# Patient Record
Sex: Female | Born: 1965 | Race: White | Hispanic: No | Marital: Married | Smoking: Former smoker
Health system: Southern US, Community
[De-identification: ages and names within clinical notes are randomized; demographics above are authoritative.]

## PROBLEM LIST (undated history)

## (undated) DIAGNOSIS — E079 Disorder of thyroid, unspecified: Secondary | ICD-10-CM

## (undated) DIAGNOSIS — E119 Type 2 diabetes mellitus without complications: Secondary | ICD-10-CM

## (undated) DIAGNOSIS — I10 Essential (primary) hypertension: Secondary | ICD-10-CM

## (undated) DIAGNOSIS — M1990 Unspecified osteoarthritis, unspecified site: Secondary | ICD-10-CM

## (undated) HISTORY — PX: APPENDECTOMY: SHX54

## (undated) HISTORY — PX: KNEE SURGERY: SHX244

---

## 2002-02-26 ENCOUNTER — Encounter: Payer: Self-pay | Admitting: Family Medicine

## 2002-02-26 ENCOUNTER — Ambulatory Visit (HOSPITAL_COMMUNITY): Admission: RE | Admit: 2002-02-26 | Discharge: 2002-02-26 | Payer: Self-pay | Admitting: Family Medicine

## 2005-07-28 ENCOUNTER — Ambulatory Visit (HOSPITAL_COMMUNITY): Admission: RE | Admit: 2005-07-28 | Discharge: 2005-07-28 | Payer: Self-pay | Admitting: Family Medicine

## 2006-05-24 ENCOUNTER — Ambulatory Visit (HOSPITAL_COMMUNITY): Admission: RE | Admit: 2006-05-24 | Discharge: 2006-05-24 | Payer: Self-pay | Admitting: Family Medicine

## 2007-04-15 ENCOUNTER — Ambulatory Visit (HOSPITAL_COMMUNITY): Admission: RE | Admit: 2007-04-15 | Discharge: 2007-04-15 | Payer: Self-pay | Admitting: Family Medicine

## 2007-10-25 ENCOUNTER — Ambulatory Visit (HOSPITAL_COMMUNITY): Admission: RE | Admit: 2007-10-25 | Discharge: 2007-10-25 | Payer: Self-pay | Admitting: General Surgery

## 2007-10-25 ENCOUNTER — Encounter (INDEPENDENT_AMBULATORY_CARE_PROVIDER_SITE_OTHER): Payer: Self-pay | Admitting: General Surgery

## 2010-11-06 ENCOUNTER — Encounter: Payer: Self-pay | Admitting: Family Medicine

## 2010-12-27 ENCOUNTER — Emergency Department (HOSPITAL_COMMUNITY)
Admission: EM | Admit: 2010-12-27 | Discharge: 2010-12-27 | Disposition: A | Discharge status: Home / Self Care | Payer: 59 | Attending: Emergency Medicine | Admitting: Emergency Medicine

## 2010-12-27 DIAGNOSIS — I10 Essential (primary) hypertension: Secondary | ICD-10-CM | POA: Insufficient documentation

## 2010-12-27 DIAGNOSIS — R42 Dizziness and giddiness: Secondary | ICD-10-CM | POA: Insufficient documentation

## 2010-12-27 DIAGNOSIS — E039 Hypothyroidism, unspecified: Secondary | ICD-10-CM | POA: Insufficient documentation

## 2010-12-27 DIAGNOSIS — Z79899 Other long term (current) drug therapy: Secondary | ICD-10-CM | POA: Insufficient documentation

## 2011-02-28 NOTE — Op Note (Signed)
Amber Acevedo, Amber Acevedo                ACCOUNT NO.:  [id number]   MEDICAL RECORD NO.:  [id number]          PATIENT TYPE:  AMB   LOCATION:  DAY                           FACILITY:  [facility]   PHYSICIAN:  Tilford Pillar, MD      DATE OF BIRTH:  24-Mar-1966   DATE OF PROCEDURE:  10/25/2007  DATE OF DISCHARGE:                               OPERATIVE REPORT   PREOPERATIVE DIAGNOSES:  Right medial breast cyst (sebaceous cyst).   POSTPROCEDURE DIAGNOSIS:  Right medial breast cyst (sebaceous cyst).   PROCEDURE:  Excision of right-breast cyst.   SURGEON:  Tilford Pillar, M.D.   ANESTHESIA:  Laryngeal mask airway, sedation and local anesthetic.   ESTIMATED BLOOD LOSS:  Minimal.   SPECIMENS:  Breast cysts times two in one specimen.   INDICATIONS:  Patient is a 45 year old female, who presented to my  office approximately a month ago with a noted erythematous and tender  nodule on her right breast on the medial aspect.  This had spontaneously  drained.  At that time it was suspected this was likely just a sebaceous  cyst, or a superficial cyst.  It was treated with antibiotics and warm  compresses with resolution.  At the followup evaluation in my office, a  palpable nodule was still appreciated in the area and it was recommended  that this be excised with discussion of excision of a cyst with the  patient.  It was also discussed with the patient, although unlikely,  that this could potentially be an early breast cancer.  However, again,  it was discussed with her at length that this was likely a benign  etiology.  At this point, the risks, benefits, and alternatives,  including the risk of infection and bleeding, were discussed at length  with the patient.  The patient's questions and concerns were addressed  and patient was consented for the planned procedure.   OPERATION:  Patient was taken to the operating room and was placed in  supine position, at which time sedation was administered by  anesthesia.  At this time, laryngeal mask airway was placed.  Her right breast was  then prepped and draped in the usual fashion.  A marking pen was  utilized to trace the planned elliptical incision.  During palpation of  the breast, there were two small, discrete nodules within the same area.  The elliptical incision was created to incorporate both of these areas.  A scalpel was then utilized to create the initial incision and  electrocautery was utilize to dissect down through the subcuticular  tissue, being careful to excise the suspected cyst in its entirety on  both sides.  Healthy tissue was identified underneath the cyst.  The  tissue was removed en bloc.  Palpation demonstrated two firm, rubbery  nodules, which were very superficial, within the specimen.  These were  placed on the back table and were sent as permanent specimen to  pathology.  At this point, hemostasis was good, being obtained with  electrocautery.  Palpation of the wound did not elicit any additional  suspected nodules or firmness  and, at this point, the wound was  irrigated with sterile saline.   The wound was dried and then 3-0 Vicryl sutures were utilized to  reapproximate the deep subcuticular tissue in simple interrupted  fashion.  At this point, 4-0 Monocryl was utilized to reapproximate the  skin edges in a running subcuticular sutures.  The skin was washed and  dried with moist and dry towels.  Marcaine 0.5% was injected and then  benzoin was applied around the incision.  Half-inch Steri-Strips were  placed.  The drapes were removed.  The patient was allowed to come out  of sedation and was transferred back to a regular hospital bed.  She was  transferred to the postanesthesia care unit in stable condition.   At the conclusion of the procedure, all instrument, sponge and needle  counts were correct.  The patient tolerated the procedure well.      Tilford Pillar, MD  Electronically Signed      BZ/MEDQ  D:  10/25/2007  T:  10/25/2007  Job:  [id number]   cc:   Patrica Duel, M.D.  Fax: [phone]

## 2011-02-28 NOTE — H&P (Signed)
Amber Acevedo, Amber Acevedo                ACCOUNT NO.:  [id number]   MEDICAL RECORD NO.:  [id number]          PATIENT TYPE:  AMB   LOCATION:  DAY                           FACILITY:  [facility]   PHYSICIAN:  Tilford Pillar, MD      DATE OF BIRTH:  10/25/1965   DATE OF ADMISSION:  DATE OF DISCHARGE:  LH                              HISTORY & PHYSICAL   CHIEF COMPLAINT:  Cyst on right breast.   HISTORY OF PRESENT ILLNESS:  The patient is a 45 year old female, who I  have seen previously in the office for a painful swelling nodule of her  right medial breast and chest wall.  She initially noted this at the  beginning of December and it increased in discomfort.  She noted a rash  at the same time of initial onset.  She had noticed some scan yellow-  green discharge associated with it.  She had had no fever or chills and  no similar nodules or masses in the past.  She says she has had prior  mammograms, but states she has not had one for approximately five years.  After her initial evaluation, she was given antibiotic treatment and was  expressed to continue warm compresses to the area.  In follow-up  evaluation in the office, this area has decreased in size and  discomfort.  The erythema has resolved.  No drainage has been noted.   PAST MEDICAL HISTORY:  Hypothyroidism.   PAST SURGICAL HISTORY:  1. Appendectomy.  2. Cesarean sections x2.   MEDICATIONS:  1. Benicar.  2. Levothyroxine.  3. Zoloft.  4. Concerta.  5. Augmentin (she currently has completed the course of Augmentin).   ALLERGIES:  NO KNOWN DRUG ALLERGIES.   SOCIAL HISTORY:  She is a one-pack-per-day tobacco user.  No alcohol  use.  No recreational drug use.  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  She has had two pregnancies.   FAMILY HISTORY:  Pertinent family history includes coronary artery  disease, diabetes mellitus, and hypertension.  She has no family history  of any breast, ovarian, or cervical  cancers.   REVIEW OF SYSTEMS:  CONSTITUTIONAL:  Occasional has.  EYES:  Unremarkable.  EARS, NOSE, AND THROAT:  Occasional rhinorrhea.  RESPIRATORY:  Occasional cough.  Occasional wheezing.  CARDIOVASCULAR:  Unremarkable.  GASTROINTESTINAL:  Unremarkable.  GENITOURINARY:  Unremarkable.  MUSCULOSKELETAL:  Arthralgias, with neck and joint pain.  SKIN:  Occasional rash and rash as per history of present illness.  She  has had a history of boils in the past.  ENDOCRINOLOGIC:  Occasionally  she has the sensation of no energy.  NEUROLOGIC:  Occasional dizzy  spells; however, these are very infrequent.   PHYSICAL EXAMINATION:  GENERAL:  The patient is a comfortable-appearing  morbidly obese female, in no acute distress.  HEENT:  Scalp:  No deformities.  No masses.  Eyes:  Pupils equal, round,  and reactive to light.  Extraocular movements intact.  No conjunctival  pallor is noted.  Oral mucosa is pink.  Normal occlusion.  NECK:  The trachea is midline.  No cervical lymphadenopathy is apparent.  PULMONARY:  She has unlabored respirations.  No wheezes are appreciated.  Chest is clear to auscultation in the bilateral lung fields.  CARDIOVASCULAR:  Regular rate and rhythm.  No murmurs or gallops, and  her pulses are 2+ radial and dorsalis pedis.  CHEST WALL/BREASTS:  The patient does have an approximate area of a 2-cm  nodule on her medial aspect of her right breast along the maxillary  fold.  This is nontender to palpation at this time.  No discharge or  drainage is noted.  No nipple discharge or drainage is apparent.  No  additional masses are appreciated.  EXTREMITIES:  The extremities are warm and dry.   ASSESSMENT AND PLAN:  Right breast cyst.  Given the recent course and  description of initial presentation, it is suspected that this is a  benign sebaceous cyst of the breast.  However, due to its location, the  possibility of early cancer cannot be excluded, and this was discussed  with  the patient.  At this point, it is recommended that this be excised  as a planned excision of a sebaceous cyst.  However, should this turn  out to be a cancer, this would be considered an excisional biopsy.  This  was discussed with the patient and, in the unlikely event that this  pathologic evaluation determines this to be a cancer, additional  intervention will be required, and this was discussed with the patient.  Again, it was discussed with the patient that this is very unlikely and  that, based on all suspicion at this time, this is a benign sebaceous  cyst.  The patient is also scheduled for a screening mammogram.  It was  advised that as this has been put off by the patient that this be  scheduled following the excision to allow adequate time for  postoperative changes to resolve should this be a benign cyst, and  otherwise immediate mammographic findings would likely be  noncontributory to the current plants.  This was understood by the  patient and will plan to schedule the patient at the earliest  convenience.      Tilford Pillar, MD  Electronically Signed     BZ/MEDQ  D:  10/22/2007  T:  10/22/2007  Job:  [id number]   cc:   Tilford Pillar, MD  Fax: [phone]

## 2011-03-17 ENCOUNTER — Ambulatory Visit (HOSPITAL_BASED_OUTPATIENT_CLINIC_OR_DEPARTMENT_OTHER)
Admission: RE | Admit: 2011-03-17 | Discharge: 2011-03-17 | Disposition: A | Discharge status: Home / Self Care | Payer: 59 | Source: Ambulatory Visit | Attending: Orthopedic Surgery | Admitting: Orthopedic Surgery

## 2011-03-17 DIAGNOSIS — M2240 Chondromalacia patellae, unspecified knee: Secondary | ICD-10-CM | POA: Insufficient documentation

## 2011-03-17 DIAGNOSIS — I10 Essential (primary) hypertension: Secondary | ICD-10-CM | POA: Insufficient documentation

## 2011-03-17 DIAGNOSIS — G4733 Obstructive sleep apnea (adult) (pediatric): Secondary | ICD-10-CM | POA: Insufficient documentation

## 2011-03-17 DIAGNOSIS — M23329 Other meniscus derangements, posterior horn of medial meniscus, unspecified knee: Secondary | ICD-10-CM | POA: Insufficient documentation

## 2011-03-17 DIAGNOSIS — F17200 Nicotine dependence, unspecified, uncomplicated: Secondary | ICD-10-CM | POA: Insufficient documentation

## 2011-03-17 DIAGNOSIS — K219 Gastro-esophageal reflux disease without esophagitis: Secondary | ICD-10-CM | POA: Insufficient documentation

## 2011-03-17 LAB — POCT I-STAT, CHEM 8
BUN: 21 mg/dL (ref 6–23)
Calcium, Ion: 1.14 mmol/L (ref 1.12–1.32)
Chloride: 102 mEq/L (ref 96–112)
Creatinine, Ser: 0.8 mg/dL (ref 0.4–1.2)
Glucose, Bld: 100 mg/dL — ABNORMAL HIGH (ref 70–99)
HCT: 44 % (ref 36.0–46.0)
Hemoglobin: 15 g/dL (ref 12.0–15.0)
Potassium: 3.8 mEq/L (ref 3.5–5.1)
Sodium: 138 mEq/L (ref 135–145)
TCO2: 29 mmol/L (ref 0–100)

## 2011-03-21 NOTE — Op Note (Signed)
  NAMELOUNETTE, SLOAN NO.:  [id number]  MEDICAL RECORD NO.:  [id number]  LOCATION:                                 FACILITY:  PHYSICIAN:  Eulas Post, MD    DATE OF BIRTH:  Apr 09, 1966  DATE OF PROCEDURE:  03/17/2011 DATE OF DISCHARGE:                              OPERATIVE REPORT   ATTENDING SURGEON:  Eulas Post, MD  ASSISTANT:  Janace Litten, OPA  PREOPERATIVE DIAGNOSIS:  Right knee medial meniscus tear.  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  OPERATIVE PROCEDURE:  Right knee arthroscopy with partial medial meniscectomy and chondroplasty of the patellofemoral joint on both the patella and the femoral trochlea.  PREOPERATIVE INDICATIONS:  Amber Acevedo is a 45 year old woman who complained of right knee pain, particularly medially, and elected to undergo right knee arthroscopy with partial medial meniscectomy.  The risks, benefits, and alternatives were discussed her at length including not limited to the risks of infection, bleeding, nerve injury, progression of arthritis, incomplete relief of pain, recurrent meniscal tear, the need for future arthroplasty, cardiopulmonary complications, among others, and she is willing to proceed.  OPERATIVE FINDINGS:  The patellofemoral joint had extensive grade 3 and some grade 4 changes on the femoral trochlea, with grade 3 changes on the patella.  The lateral compartment was essentially normal.  The ACL and PCL were intact.  The medial compartment had grade 2 changes on the femur and grade 2 changes on the tibia with a radial horn medial meniscus tear that appeared to have attempted to heal, but was somewhat extruded, and unstable.  OPERATIVE PROCEDURE:  The  patient was brought to the operating room, placed in supine position.  IV antibiotics were given.  General anesthesia administered.  The right lower extremity was prepped and draped in the usual sterile fashion.  Diagnostic arthroscopy was carried out with the above-named findings.  I used the arthroscopic shaver to debride the undersurface of the patella as well as the femoral trochlea. I also used a shaver and the arthroscopic biter to debride the posterior horn of the medial meniscus back to a stable rim.  She had reasonably substantial degenerative changes that may limit her long-term recovery. After the meniscus was debrided back to a stable rim, I completed the diagnostic arthroscopy, and all the arthroscopic instruments were removed after copious debridement and the portals closed with Monocryl followed by Steri-Strips and sterile gauze.  She was awakened and returned to the PACU in stable and satisfactory condition.  There were no complications, and she tolerated the procedure well.     Eulas Post, MD     JPL/MEDQ  D:  03/17/2011  T:  03/18/2011  Job:  [id number]  Electronically Signed by Teryl Lucy MD on 03/21/2011 07:29:07 AM

## 2011-07-06 LAB — BASIC METABOLIC PANEL
Calcium: 9
GFR calc Af Amer: >60
GFR calc non Af Amer: >60
Sodium: 136

## 2011-07-06 LAB — CBC
Hemoglobin: 13.9
RBC: 4.56
RDW: 13.4
WBC: 9.8

## 2014-03-22 ENCOUNTER — Emergency Department (HOSPITAL_COMMUNITY): Payer: 59

## 2014-03-22 ENCOUNTER — Encounter (HOSPITAL_COMMUNITY): Payer: Self-pay | Admitting: Emergency Medicine

## 2014-03-22 ENCOUNTER — Emergency Department (HOSPITAL_COMMUNITY)
Admission: EM | Admit: 2014-03-22 | Discharge: 2014-03-22 | Disposition: A | Discharge status: Home / Self Care | Payer: 59 | Attending: Emergency Medicine | Admitting: Emergency Medicine

## 2014-03-22 DIAGNOSIS — X500XXA Overexertion from strenuous movement or load, initial encounter: Secondary | ICD-10-CM | POA: Insufficient documentation

## 2014-03-22 DIAGNOSIS — E119 Type 2 diabetes mellitus without complications: Secondary | ICD-10-CM | POA: Insufficient documentation

## 2014-03-22 DIAGNOSIS — Z862 Personal history of diseases of the blood and blood-forming organs and certain disorders involving the immune mechanism: Secondary | ICD-10-CM | POA: Insufficient documentation

## 2014-03-22 DIAGNOSIS — I10 Essential (primary) hypertension: Secondary | ICD-10-CM | POA: Insufficient documentation

## 2014-03-22 DIAGNOSIS — M25561 Pain in right knee: Secondary | ICD-10-CM

## 2014-03-22 DIAGNOSIS — Z8639 Personal history of other endocrine, nutritional and metabolic disease: Secondary | ICD-10-CM | POA: Insufficient documentation

## 2014-03-22 DIAGNOSIS — Z8739 Personal history of other diseases of the musculoskeletal system and connective tissue: Secondary | ICD-10-CM | POA: Insufficient documentation

## 2014-03-22 DIAGNOSIS — Y929 Unspecified place or not applicable: Secondary | ICD-10-CM | POA: Insufficient documentation

## 2014-03-22 DIAGNOSIS — S99929A Unspecified injury of unspecified foot, initial encounter: Principal | ICD-10-CM

## 2014-03-22 DIAGNOSIS — Y9389 Activity, other specified: Secondary | ICD-10-CM | POA: Insufficient documentation

## 2014-03-22 DIAGNOSIS — Z880 Allergy status to penicillin: Secondary | ICD-10-CM | POA: Insufficient documentation

## 2014-03-22 DIAGNOSIS — S99919A Unspecified injury of unspecified ankle, initial encounter: Principal | ICD-10-CM

## 2014-03-22 DIAGNOSIS — S8990XA Unspecified injury of unspecified lower leg, initial encounter: Secondary | ICD-10-CM | POA: Insufficient documentation

## 2014-03-22 DIAGNOSIS — F17200 Nicotine dependence, unspecified, uncomplicated: Secondary | ICD-10-CM | POA: Insufficient documentation

## 2014-03-22 HISTORY — DX: Unspecified osteoarthritis, unspecified site: M19.90

## 2014-03-22 HISTORY — DX: Type 2 diabetes mellitus without complications: E11.9

## 2014-03-22 HISTORY — DX: Essential (primary) hypertension: I10

## 2014-03-22 HISTORY — DX: Disorder of thyroid, unspecified: E07.9

## 2014-03-22 MED ORDER — OXYCODONE-ACETAMINOPHEN 5-325 MG PO TABS: 1.0000 | ORAL_TABLET | ORAL | Status: DC | PRN

## 2014-03-22 NOTE — Discharge Instructions (Signed)
Wear the immobilizer, use crutches as needed.  Crutch Use Crutches are used to take weight off one of your legs or feet when you stand or walk. It is important to use crutches that fit properly. When fitted properly:  Each crutch should be 2 3 finger widths below the armpit.  Your weight should be supported by your hand, and not by resting the armpit on the crutch.  RISKS AND COMPLICATIONS Damage to the nerves that extend from your armpit to your hand and arm. To prevent this from happening, make sure your crutches fit properly and do not put pressure on your armpit when using them. HOW TO USE YOUR CRUTCHES If you have been instructed to use partial weight bearing, apply (bear) the amount of weight as your health care provider suggests. Do not bear weight in an amount that causes pain to the area of injury. Walking 1. Step with the crutches. 2. Swing the healthy leg slightly ahead of the crutches. Going Up Steps If there is no handrail: 1. Step up with the healthy leg. 2. Step up with the crutches and injured leg. 3. Continue in this way. If there is a handrail: 1. Hold both crutches in one hand. 2. Place your free hand on the handrail. 3. While putting your weight on your arms, lift your healthy leg to the step. 4. Bring the crutches and the injured leg up to that step. 5. Continue in this way. Going Down Steps Be very careful, as going down stairs with crutches is very challenging. If there is no handrail: 1. Step down with the injured leg and crutches. 2. Step down with the healthy leg. If there is a handrail: 1. Place your hand on the handrail. 2. Hold both crutches with your free hand. 3. Lower your injured leg and crutch to the step below you. Make sure to keep the crutch tips in the center of the step, never on the edge. 4. Lower your healthy leg to that step. 5. Continue in this way. Standing Up 1. Hold the injured leg forward. 2. Grab the armrest with one hand and the  top of the crutches with the other hand. 3. Using these supports, pull yourself up to a standing position. Sitting Down 1. Hold the injured leg forward. 2. Grab the armrest with one hand and the top of the crutches with the other hand. 3. Lower yourself to a sitting position. SEEK MEDICAL CARE IF:  You still feel unsteady on your feet.  You develop new pain, for example in your armpits, back, shoulder, wrist, or hip.  You develop any numbness or tingling. SEEK IMMEDIATE MEDICAL CARE: You fall. Document Released: 09/29/2000 Document Revised: 07/23/2013 Document Reviewed: 06/09/2013 [facility] Patient Information 2014 [place], [place].  Acetaminophen; Oxycodone tablets What is this medicine? ACETAMINOPHEN; OXYCODONE (a set a MEE noe fen; ox i KOE done) is a pain reliever. It is used to treat mild to moderate pain. This medicine may be used for other purposes; ask your health care provider or pharmacist if you have questions. COMMON BRAND NAME(S): Endocet, Magnacet, Narvox, Percocet, Perloxx, Primalev, Primlev, Roxicet, Xolox What should I tell my health care provider before I take this medicine? They need to know if you have any of these conditions: -brain tumor -Crohn's disease, inflammatory bowel disease, or ulcerative colitis -drug abuse or addiction -head injury -heart or circulation problems -if you often drink alcohol -kidney disease or problems going to the bathroom -liver disease -lung disease, asthma, or breathing problems -  an unusual or allergic reaction to acetaminophen, oxycodone, other opioid analgesics, other medicines, foods, dyes, or preservatives -pregnant or trying to get pregnant -breast-feeding How should I use this medicine? Take this medicine by mouth with a full glass of water. Follow the directions on the prescription label. Take your medicine at regular intervals. Do not take your medicine more often than directed. Talk to your pediatrician regarding the use  of this medicine in children. Special care may be needed. Patients over 20 years old may have a stronger reaction and need a smaller dose. Overdosage: If you think you have taken too much of this medicine contact a poison control center or emergency room at once. NOTE: This medicine is only for you. Do not share this medicine with others. What if I miss a dose? If you miss a dose, take it as soon as you can. If it is almost time for your next dose, take only that dose. Do not take double or extra doses. What may interact with this medicine? -alcohol -antihistamines -barbiturates like amobarbital, butalbital, butabarbital, methohexital, pentobarbital, phenobarbital, thiopental, and secobarbital -benztropine -drugs for bladder problems like solifenacin, trospium, oxybutynin, tolterodine, hyoscyamine, and methscopolamine -drugs for breathing problems like ipratropium and tiotropium -drugs for certain stomach or intestine problems like propantheline, homatropine methylbromide, glycopyrrolate, atropine, belladonna, and dicyclomine -general anesthetics like etomidate, ketamine, nitrous oxide, propofol, desflurane, enflurane, halothane, isoflurane, and sevoflurane -medicines for depression, anxiety, or psychotic disturbances -medicines for sleep -muscle relaxants -naltrexone -narcotic medicines (opiates) for pain -phenothiazines like perphenazine, thioridazine, chlorpromazine, mesoridazine, fluphenazine, prochlorperazine, promazine, and trifluoperazine -scopolamine -tramadol -trihexyphenidyl This list may not describe all possible interactions. Give your health care provider a list of all the medicines, herbs, non-prescription drugs, or dietary supplements you use. Also tell them if you smoke, drink alcohol, or use illegal drugs. Some items may interact with your medicine. What should I watch for while using this medicine? Tell your doctor or health care professional if your pain does not go away,  if it gets worse, or if you have new or a different type of pain. You may develop tolerance to the medicine. Tolerance means that you will need a higher dose of the medication for pain relief. Tolerance is normal and is expected if you take this medicine for a long time. Do not suddenly stop taking your medicine because you may develop a severe reaction. Your body becomes used to the medicine. This does NOT mean you are addicted. Addiction is a behavior related to getting and using a drug for a non-medical reason. If you have pain, you have a medical reason to take pain medicine. Your doctor will tell you how much medicine to take. If your doctor wants you to stop the medicine, the dose will be slowly lowered over time to avoid any side effects. You may get drowsy or dizzy. Do not drive, use machinery, or do anything that needs mental alertness until you know how this medicine affects you. Do not stand or sit up quickly, especially if you are an older patient. This reduces the risk of dizzy or fainting spells. Alcohol may interfere with the effect of this medicine. Avoid alcoholic drinks. There are different types of narcotic medicines (opiates) for pain. If you take more than one type at the same time, you may have more side effects. Give your health care provider a list of all medicines you use. Your doctor will tell you how much medicine to take. Do not take more medicine than directed. Call  emergency for help if you have problems breathing. The medicine will cause constipation. Try to have a bowel movement at least every 2 to 3 days. If you do not have a bowel movement for 3 days, call your doctor or health care professional. Do not take Tylenol (acetaminophen) or medicines that have acetaminophen with this medicine. Too much acetaminophen can be very dangerous. Many nonprescription medicines contain acetaminophen. Always read the labels carefully to avoid taking more acetaminophen. What side effects may I  notice from receiving this medicine? Side effects that you should report to your doctor or health care professional as soon as possible: -allergic reactions like skin rash, itching or hives, swelling of the face, lips, or tongue -breathing difficulties, wheezing -confusion -light headedness or fainting spells -severe stomach pain -unusually weak or tired -yellowing of the skin or the whites of the eyes  Side effects that usually do not require medical attention (report to your doctor or health care professional if they continue or are bothersome): -dizziness -drowsiness -nausea -vomiting This list may not describe all possible side effects. Call your doctor for medical advice about side effects. You may report side effects to FDA at 1-800-FDA-1088. Where should I keep my medicine? Keep out of the reach of children. This medicine can be abused. Keep your medicine in a safe place to protect it from theft. Do not share this medicine with anyone. Selling or giving away this medicine is dangerous and against the law. Store at room temperature between 20 and 25 degrees C (68 and 77 degrees F). Keep container tightly closed. Protect from light. This medicine may cause accidental overdose and death if it is taken by other adults, children, or pets. Flush any unused medicine down the toilet to reduce the chance of harm. Do not use the medicine after the expiration date. NOTE: This sheet is a summary. It may not cover all possible information. If you have questions about this medicine, talk to your doctor, pharmacist, or health care provider.  2014, [employer]. (2013-05-26 13:17:35)

## 2014-03-22 NOTE — ED Notes (Addendum)
Pt reporting pain in right knee.  Reports history of arthritis and torn meniscus in knee.  Pt states that she "felt something pop" in knee one day last week, pain increased since that time.

## 2014-03-22 NOTE — ED Provider Notes (Signed)
CSN: [id number]     Arrival date & time 03/22/14  0111 History   First MD Initiated Contact with Patient 03/22/14 0129     Chief Complaint  Patient presents with  . Knee Pain     (Consider location/radiation/quality/duration/timing/severity/associated sxs/prior Treatment) Patient is a 48 y.o. female presenting with knee pain. The history is provided by the patient.  Knee Pain She injured her right knee about one week ago when she rolled over in bed and felt something pop. Since then, she is complaining of pain in the right knee. Pain is severe and she rates it a 9/10. It is worse with any movement. Nothing makes it better. She's taking over-the-counter ibuprofen with no relief. Pain is similar to what she had several years ago when she tore a meniscus in her knee.  Past Medical History  Diagnosis Date  . Diabetes mellitus without complication   . Hypertension   . Thyroid disease   . Arthritis    Past Surgical History  Procedure Laterality Date  . Appendectomy    . Knee surgery    . Cesarean section     History reviewed. No pertinent family history. History  Substance Use Topics  . Smoking status: Current Every Day Smoker -- 1.00 packs/day  . Smokeless tobacco: Not on file  . Alcohol Use: No   OB History   Grav Para Term Preterm Abortions TAB SAB Ect Mult Living                 Review of Systems  All other systems reviewed and are negative.     Allergies  Amoxicillin  Home Medications   Prior to Admission medications   Not on File   BP 139/82  Pulse 80  Temp(Src) 98.7 F (37.1 C) (Oral)  Resp 18  Ht 5' (1.524 m)  Wt 244 lb (110.678 kg)  BMI 47.65 kg/m2  SpO2 100% Physical Exam  Nursing note and vitals reviewed.  48 year old female, resting comfortably and in no acute distress. Vital signs are normal. Oxygen saturation is 100%, which is normal. Head is normocephalic and atraumatic. PERRLA, EOMI. Oropharynx is clear. Neck is nontender and supple without  adenopathy or JVD. Back is nontender and there is no CVA tenderness. Lungs are clear without rales, wheezes, or rhonchi. Chest is nontender. Heart has regular rate and rhythm without murmur. Abdomen is soft, flat, nontender without masses or hepatosplenomegaly and peristalsis is normoactive. Extremities: There is a small effusion present in the right knee. There is no tenderness anteriorly, medially, laterally but there is tenderness in the popliteal fossa and extending into the proximal part of the calf. There is no instability on valgus or varus stress. There is significant pain with any passive movement of the knee. Lachman and McMurray's tests are grossly negative but suboptimal because of degree of pain with passive movement of the knee. Distal neurovascular exam is intact with strong pulses, prompt capillary refill, and normal sensation. Skin is warm and dry without rash. Neurologic: Mental status is normal, cranial nerves are intact, there are no motor or sensory deficits.  ED Course  Procedures (including critical care time)  Imaging Review Dg Knee Complete 4 Views Right  03/22/2014   CLINICAL DATA:  Knee pain.  EXAM: RIGHT KNEE - COMPLETE 4+ VIEW  COMPARISON:  MRI 08/15/2010.  FINDINGS: There are tricompartmental degenerative changes with joint space narrowing and osteophytic spurring. No acute fracture or osteochondral lesion. There is a moderate-sized joint effusion.  IMPRESSION:  Tricompartmental degenerative changes but no acute fracture.  Moderate-sized joint effusion.   Electronically Signed   By: Loralie ChampagneMark  Gallerani M.D.   On: 03/22/2014 02:22    MDM   Final diagnoses:  Pain in right knee    Right knee pain of uncertain cause. Possible Baker's cyst, possible meniscus injury. She'll be sent for plain films of her knee and referred to orthopedics for followup. She will be placed in knee immobilizer, given crutches, and given a prescription for oxycodone-acetaminophen for  pain.    Dione Boozeavid Xachary Hambly, MD 03/22/14 [phone]

## 2014-03-24 MED FILL — Oxycodone w/ Acetaminophen Tab 5-325 MG: ORAL | Qty: 6 | Status: AC

## 2015-03-17 ENCOUNTER — Ambulatory Visit (HOSPITAL_COMMUNITY)
Admission: RE | Admit: 2015-03-17 | Discharge: 2015-03-17 | Disposition: A | Discharge status: Home / Self Care | Payer: Self-pay | Attending: Psychiatry | Admitting: Psychiatry
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
## 2015-06-02 ENCOUNTER — Inpatient Hospital Stay
Admit: 2015-06-02 | Discharge: 2015-06-07 | DRG: 885 | Disposition: A | Discharge status: Home / Self Care | Payer: No Typology Code available for payment source | Source: Intra-hospital | Attending: Psychiatry | Admitting: Psychiatry

## 2015-06-02 DIAGNOSIS — Z880 Allergy status to penicillin: Secondary | ICD-10-CM

## 2015-06-02 DIAGNOSIS — Z9889 Other specified postprocedural states: Secondary | ICD-10-CM | POA: Diagnosis not present

## 2015-06-02 DIAGNOSIS — Z9049 Acquired absence of other specified parts of digestive tract: Secondary | ICD-10-CM | POA: Diagnosis present

## 2015-06-02 DIAGNOSIS — Z818 Family history of other mental and behavioral disorders: Secondary | ICD-10-CM | POA: Diagnosis not present

## 2015-06-02 DIAGNOSIS — F4310 Post-traumatic stress disorder, unspecified: Secondary | ICD-10-CM | POA: Diagnosis present

## 2015-06-02 DIAGNOSIS — G4700 Insomnia, unspecified: Secondary | ICD-10-CM | POA: Diagnosis present

## 2015-06-02 DIAGNOSIS — R21 Rash and other nonspecific skin eruption: Secondary | ICD-10-CM | POA: Diagnosis present

## 2015-06-02 DIAGNOSIS — F333 Major depressive disorder, recurrent, severe with psychotic symptoms: Principal | ICD-10-CM | POA: Diagnosis present

## 2015-06-02 DIAGNOSIS — F42 Obsessive-compulsive disorder: Secondary | ICD-10-CM | POA: Diagnosis present

## 2015-06-02 DIAGNOSIS — M1990 Unspecified osteoarthritis, unspecified site: Secondary | ICD-10-CM | POA: Diagnosis present

## 2015-06-02 DIAGNOSIS — I10 Essential (primary) hypertension: Secondary | ICD-10-CM | POA: Diagnosis present

## 2015-06-02 DIAGNOSIS — Z79899 Other long term (current) drug therapy: Secondary | ICD-10-CM

## 2015-06-02 DIAGNOSIS — F17210 Nicotine dependence, cigarettes, uncomplicated: Secondary | ICD-10-CM | POA: Diagnosis present

## 2015-06-02 DIAGNOSIS — F17200 Nicotine dependence, unspecified, uncomplicated: Secondary | ICD-10-CM

## 2015-06-02 DIAGNOSIS — E119 Type 2 diabetes mellitus without complications: Secondary | ICD-10-CM | POA: Diagnosis present

## 2015-06-02 DIAGNOSIS — Z79891 Long term (current) use of opiate analgesic: Secondary | ICD-10-CM | POA: Diagnosis not present

## 2015-06-02 DIAGNOSIS — E039 Hypothyroidism, unspecified: Secondary | ICD-10-CM | POA: Diagnosis present

## 2015-06-02 DIAGNOSIS — F410 Panic disorder [episodic paroxysmal anxiety] without agoraphobia: Secondary | ICD-10-CM | POA: Diagnosis present

## 2015-06-02 DIAGNOSIS — F332 Major depressive disorder, recurrent severe without psychotic features: Secondary | ICD-10-CM | POA: Diagnosis present

## 2015-06-02 LAB — COMPREHENSIVE METABOLIC PANEL
ALBUMIN: 4.2 g/dL (ref 3.5–5.0)
ALT: 13 U/L — AB (ref 14–54)
AST: 19 U/L (ref 15–41)
Alkaline Phosphatase: 89 U/L (ref 38–126)
Anion gap: 10 (ref 5–15)
BUN: 15 mg/dL (ref 6–20)
CHLORIDE: 99 mmol/L — AB (ref 101–111)
CO2: 25 mmol/L (ref 22–32)
CREATININE: 0.73 mg/dL (ref 0.44–1.00)
Calcium: 8.9 mg/dL (ref 8.9–10.3)
GFR calc Af Amer: >60 mL/min (ref >60)
GLUCOSE: 129 mg/dL — AB (ref 65–99)
POTASSIUM: 3.4 mmol/L — AB (ref 3.5–5.1)
Sodium: 134 mmol/L — ABNORMAL LOW (ref 135–145)
Total Bilirubin: 0.7 mg/dL (ref 0.3–1.2)
Total Protein: 7.5 g/dL (ref 6.5–8.1)

## 2015-06-02 LAB — CBC
HEMATOCRIT: 47.4 % — AB (ref 35.0–47.0)
Hemoglobin: 16 g/dL (ref 12.0–16.0)
MCH: 30.2 pg (ref 26.0–34.0)
MCHC: 33.7 g/dL (ref 32.0–36.0)
MCV: 89.5 fL (ref 80.0–100.0)
PLATELETS: 238 10*3/uL (ref 150–440)
RBC: 5.29 MIL/uL — ABNORMAL HIGH (ref 3.80–5.20)
RDW: 13.3 % (ref 11.5–14.5)
WBC: 11 10*3/uL (ref 3.6–11.0)

## 2015-06-02 LAB — LIPID PANEL
Cholesterol: 176 mg/dL (ref 0–200)
HDL: 63 mg/dL (ref >40)
LDL Cholesterol: 84 mg/dL (ref 0–99)
TRIGLYCERIDES: 144 mg/dL (ref <150)
Total CHOL/HDL Ratio: 2.8 RATIO
VLDL: 29 mg/dL (ref 0–40)

## 2015-06-02 LAB — ETHANOL: Alcohol, Ethyl (B): <5 mg/dL (ref <5)

## 2015-06-02 LAB — TSH: TSH: 0.184 u[IU]/mL — AB (ref 0.350–4.500)

## 2015-06-02 MED ORDER — OLANZAPINE 10 MG IM SOLR
10.0000 mg | Freq: Two times a day (BID) | INTRAMUSCULAR | Status: DC | PRN
  Filled 2015-06-02: qty 10

## 2015-06-02 MED ORDER — MAGNESIUM HYDROXIDE 400 MG/5ML PO SUSP: 30.0000 mL | Freq: Every day | ORAL | Status: DC | PRN

## 2015-06-02 MED ORDER — LEVOTHYROXINE SODIUM 75 MCG PO TABS: 300.0000 ug | ORAL_TABLET | Freq: Every day | ORAL | Status: DC

## 2015-06-02 MED ORDER — IBUPROFEN 600 MG PO TABS: 600.0000 mg | ORAL_TABLET | Freq: Four times a day (QID) | ORAL | Status: DC | PRN

## 2015-06-02 MED ORDER — ALUM & MAG HYDROXIDE-SIMETH 200-200-20 MG/5ML PO SUSP: 30.0000 mL | ORAL | Status: DC | PRN

## 2015-06-02 MED ORDER — LORAZEPAM 2 MG PO TABS
2.0000 mg | ORAL_TABLET | Freq: Three times a day (TID) | ORAL | Status: DC | PRN
Start: 2015-06-02 — End: 2015-06-03
  Administered 2015-06-02: 2 mg via ORAL
  Filled 2015-06-02: qty 1

## 2015-06-02 MED ORDER — LEVOTHYROXINE SODIUM 75 MCG PO TABS
250.0000 ug | ORAL_TABLET | Freq: Every day | ORAL | Status: DC
  Administered 2015-06-03 – 2015-06-07 (×5): 250 ug via ORAL
  Filled 2015-06-02 (×5): qty 2

## 2015-06-02 MED ORDER — OLANZAPINE 10 MG PO TABS: 10.0000 mg | ORAL_TABLET | Freq: Three times a day (TID) | ORAL | Status: DC | PRN

## 2015-06-02 MED ORDER — TRAZODONE HCL 100 MG PO TABS
100.0000 mg | ORAL_TABLET | Freq: Every evening | ORAL | Status: DC | PRN
  Administered 2015-06-03 – 2015-06-06 (×4): 100 mg via ORAL
  Filled 2015-06-02 (×5): qty 1

## 2015-06-02 MED ORDER — NICOTINE 21 MG/24HR TD PT24
21.0000 mg | MEDICATED_PATCH | Freq: Every day | TRANSDERMAL | Status: DC
  Administered 2015-06-03 – 2015-06-04 (×2): 21 mg via TRANSDERMAL
  Filled 2015-06-02 (×3): qty 1

## 2015-06-02 MED ORDER — ACETAMINOPHEN 325 MG PO TABS: 650.0000 mg | ORAL_TABLET | Freq: Four times a day (QID) | ORAL | Status: DC | PRN

## 2015-06-02 NOTE — Tx Team (Signed)
Initial Interdisciplinary Treatment Plan   PATIENTSTRESSORS: Health problems Traumatic event feels she kill people     PATIENT STRENGTHS: Ability for insight Average or above average intelligence Capable of independent living Supportive family/friends   PROBLEM LIST: Problem List/Patient Goals Date to be addressed Date deferred Reason deferred Estimated date of resolution  Altered Thought  06/02/15     Delusional   06/02/15                                                DISCHARGE CRITERIA:  Adequate post-discharge living arrangements Safe-care adequate arrangements made  PRELIMINARY DISCHARGE PLAN: Outpatient therapy Participate in family therapy  PATIENT/FAMIILY INVOLVEMENT: This treatment plan has been presented to and reviewed with the patient, Amber Acevedo, and/or family member, .  The patient and family have been given the opportunity to ask questions and make suggestions.  [name] A Rilee Knoll 06/02/2015, 7:07 PM

## 2015-06-02 NOTE — Plan of Care (Signed)
Problem: Alteration in thought process Goal: LTG-Patient has not harmed self or others in at least 2 days Outcome: Not Progressing delusional at present

## 2015-06-02 NOTE — Progress Notes (Signed)
Admission Note:  D: Pt appeared depressed  With  a flat affect.  Pt  denies SI / AVH at this time. Patient stated she feels she has killed people in her family. Stated also she feels her family brought her here and dumped her   And laugh about ir.  Pt constantly voice of people out to get her , tearful during  interview,  Pt is redirectable and cooperative with assessment.      A: Pt admitted to unit per protocol, skin assessment and search done and no contraband found.  Pt  educated on therapeutic milieu rules. Pt was introduced to milieu by nursing staff.    R: Pt was receptive to education about the milieu .  15 min safety checks started. Clinical research associate offered support

## 2015-06-03 ENCOUNTER — Encounter: Payer: Self-pay | Admitting: Psychiatry

## 2015-06-03 DIAGNOSIS — F333 Major depressive disorder, recurrent, severe with psychotic symptoms: Secondary | ICD-10-CM | POA: Diagnosis present

## 2015-06-03 LAB — URINALYSIS COMPLETE WITH MICROSCOPIC (ARMC ONLY)
Bilirubin Urine: NEGATIVE
Glucose, UA: NEGATIVE mg/dL
Ketones, ur: NEGATIVE mg/dL
Leukocytes, UA: NEGATIVE
Nitrite: NEGATIVE
PH: 7 (ref 5.0–8.0)
PROTEIN: NEGATIVE mg/dL
Specific Gravity, Urine: 1.009 (ref 1.005–1.030)

## 2015-06-03 LAB — PREGNANCY, URINE: Preg Test, Ur: NEGATIVE

## 2015-06-03 LAB — URINE DRUG SCREEN, QUALITATIVE (ARMC ONLY)
Amphetamines, Ur Screen: NOT DETECTED
Barbiturates, Ur Screen: NOT DETECTED
Benzodiazepine, Ur Scrn: NOT DETECTED
CANNABINOID 50 NG, UR ~~LOC~~: NOT DETECTED
COCAINE METABOLITE, UR ~~LOC~~: NOT DETECTED
MDMA (ECSTASY) UR SCREEN: NOT DETECTED
Methadone Scn, Ur: NOT DETECTED
OPIATE, UR SCREEN: NOT DETECTED
PHENCYCLIDINE (PCP) UR S: NOT DETECTED
Tricyclic, Ur Screen: NOT DETECTED

## 2015-06-03 LAB — HEMOGLOBIN A1C: HEMOGLOBIN A1C: 5.5 % (ref 4.0–6.0)

## 2015-06-03 MED ORDER — FLUVOXAMINE MALEATE 50 MG PO TABS
50.0000 mg | ORAL_TABLET | Freq: Every day | ORAL | Status: DC
  Administered 2015-06-03: 50 mg via ORAL
  Filled 2015-06-03: qty 1

## 2015-06-03 MED ORDER — HALOPERIDOL 2 MG PO TABS
2.0000 mg | ORAL_TABLET | Freq: Every day | ORAL | Status: DC
  Administered 2015-06-03 – 2015-06-06 (×4): 2 mg via ORAL
  Filled 2015-06-03 (×4): qty 1

## 2015-06-03 MED ORDER — LORAZEPAM 0.5 MG PO TABS
0.5000 mg | ORAL_TABLET | Freq: Four times a day (QID) | ORAL | Status: DC | PRN
  Administered 2015-06-03: 0.5 mg via ORAL
  Filled 2015-06-03: qty 1

## 2015-06-03 NOTE — Progress Notes (Signed)
D: Patient denies SI/HI/AVH.  Patient affect is flat and her mood is depressed and anxious.  Patient did NOT attend evening group. Patient visible on the milieu. No distress noted. A: Support and encouragement offered. Scheduled medications given to pt. Q 15 min checks continued for patient safety. R: Patient receptive. Patient remains safe on the unit.

## 2015-06-03 NOTE — [facility] Group Notes (Signed)
[facility] Group Notes:  (Nursing/MHT/Case Management/Adjunct)  Date:  06/03/2015  Time:  9:37 AM  Goal  Participation Level:  Goal Setting  Participation Quality:  Appropriate  Affect:  Appropriate  Cognitive:  Appropriate  Insight:  Good  Engagement in Group:  Engaged  Modes of Intervention:  Activity  Summary of Progress/Problems:  Mayra Neer 06/03/2015, 9:37 AM

## 2015-06-03 NOTE — Tx Team (Signed)
Interdisciplinary Treatment Plan Update (Adult)  Date:  06/03/2015 Time Reviewed:  1:34 PM  Progress in Treatment: Attending groups: Yes. Participating in groups:  Yes. Taking medication as prescribed:  Yes. Tolerating medication:  Yes. Family/Significant othe contact made:  No, will contact:  Family Patient understands diagnosis:  Yes. Discussing patient identified problems/goals with staff:  Yes. Medical problems stabilized or resolved:  Yes. Denies suicidal/homicidal ideation: Yes.  Issues/concerns per patient self-inventory:  Yes. Other:  New problem(s) identified: No, Describe:  None  Discharge Plan or Barriers: Pt plans to return home and follow up with outpatient.   Reason for Continuation of Hospitalization: Delusions  Medication stabilization  Comments:Amber Acevedo has a history of depression. She was treated with Zoloft and Concerta with improvement for several years. She stopped taking medication a year and a half ago. She gradually became increasingly depressed and anxious. She felt that she was able to control her symptoms but in the past several months he became psychotic. She is paranoid feeling that people are out to get her. She believes that her family is going to harm her for all the awful things that she has done to them. She believes that she could have killed some of her family members. She believes that everybody was her by her now conspired to put her in the hospital forever. She hears her name called. She sees shadows. She reports extremely poor sleep, decreased appetite with weight loss, poor energy and concentration, social isolation, feeling of guilt and hopelessness worthlessness, anhedonia, crying spells and psychotic symptoms. She reports heightened anxiety with panic attacks, nightmares and flashbacks of PTSD type, and OCD with excessive worries cleaning, organizing, rechecking information. She has been drawing attention to her unusual behavior from family and  from coworkers. She denies symptoms suggestive of bipolar mania. She denies alcohol or illicit substance use. Past psychiatric history. She has never been hospitalized. No history of substance use. No suicide attempts. She was treated in the past with Zoloft for depression and Concerta for ADHD. She believes that the medications were helpful. She stopped taking them one half years ago. She did [facility] a [facility] recently but was only prescribed trazodone for sleep.  Estimated length of stay: Possible d/c on Monday   New goal(s): NA  Review of initial/current patient goals per problem list:   1.  Goal(s): Patient will participate in aftercare plan * Met:  * Target date: at discharge * As evidenced by: Patient will participate within aftercare plan AEB aftercare provider and housing plan at discharge being identified.  2.  Goal (s): Patient will demonstrate decreased symptoms of psychosis. * Met: No  *  Target date: at discharge * As evidenced by: Patient will not endorse signs of psychosis or be deemed stable for discharge by MD.   Attendees: Patient:  Amber Acevedo 8/18/20161:34 PM  Family:   8/18/20161:34 PM  Physician:  Dr. Guadlupe Spanish  8/18/20161:34 PM  Nursing:   Floyde Parkins, RN  8/18/20161:34 PM  Clinical Social Worker:  [place], [place] 8/18/20161:34 PM  Counselor:   8/18/20161:34 PM  Other:  Everitt Amber, LRT  8/18/20161:34 PM  Other:   8/18/20161:34 PM  Other:   8/18/20161:34 PM  Other:  8/18/20161:34 PM  Other:  8/18/20161:34 PM  Other:  8/18/20161:34 PM  Other:  8/18/20161:34 PM  Other:  8/18/20161:34 PM  Other:  8/18/20161:34 PM  Other:   8/18/20161:34 PM   Scribe for Treatment Team:   Eual Lindstrom L Leonia Heatherly,MSW, LCSWA  06/03/2015, 1:34 PM

## 2015-06-03 NOTE — [facility] Group Notes (Signed)
[facility] Group Notes:  (Nursing/MHT/Case Management/Adjunct)  Date:  06/03/2015  Time:  1:51 PM  Type of Therapy:  Psychoeducational Skills  Participation Level:  Minimal  Participation Quality:  Appropriate  Affect:  Flat  Cognitive:  Appropriate  Insight:  Appropriate  Engagement in Group:  Engaged  Modes of Intervention:  Activity and Socialization  Summary of Progress/Problems:  Amber Acevedo 06/03/2015, 1:51 PM

## 2015-06-03 NOTE — Progress Notes (Signed)
Recreation Therapy Notes  Date: 08.18.16 Time: 3:00 pm Location: Craft Room  Group Topic: Leisure Education  Goal Area(s) Addresses:  Patient will identify activities for each letter of the alphabet. Patient will verbalize ability to integrate positive leisure into life post d/c. Patient will verbalize ability to use leisure as a coping mechanism.  Behavioral Response: Attentive, Interactive  Intervention: Leisure Alphabet  Activity: Patients were given a leisure Information systems manager and instructed to list healthy leisure activities for each letter of the alphabet.  Education: LRT educated patient on what is needed to participate in leisure  Education Outcome: Acknowledges education/In group clarification offered   Clinical Observations/Feedback: Patient completed approximately 95% of activity. Patient contributed to group discussion by stating healthy leisure activities.  Jacquelynn Cree, LRT/CTRS 06/03/2015 4:02 PM

## 2015-06-03 NOTE — H&P (Signed)
Psychiatric Admission Assessment Adult  Patient Identification: Amber Acevedo MRN:  [id number] Date of Evaluation:  06/03/2015 Chief Complaint:  SCHIZOPHRENIA Principal Diagnosis: Major depressive disorder, recurrent episode, severe with mood-congruent psychotic features Diagnosis:   Patient Active Problem List   Diagnosis Date Noted  . Major depressive disorder, recurrent episode, severe with mood-congruent psychotic features [F33.3] 06/03/2015  . HTN (hypertension) [I10] 06/02/2015  . Hypothyroidism [E03.9] 06/02/2015  . Tobacco use disorder [Z72.0] 06/02/2015   History of Present Illness::   Identifying data. Ms. Amber Acevedo is an 49 year old female with history of depression and anxiety.  Chief complaint. "I am paranoid."  History of present illness. Ms. Amber Acevedo has a history of depression. She was treated with Zoloft and Concerta with improvement for several years. She stopped taking medication a year and a half ago. She gradually became increasingly depressed and anxious. She felt that she was able to control her symptoms but in the past several months he became psychotic. She is paranoid feeling that people are out to get her. She believes that her family is going to harm her for all the awful things that she has done to them. She believes that she could have killed some of her family members. She believes that everybody was her by her now conspired to put her in the hospital forever. She hears her name called. She sees shadows. She reports extremely poor sleep, decreased appetite with weight loss, poor energy and concentration, social isolation, feeling of guilt and hopelessness worthlessness, anhedonia, crying spells and psychotic symptoms. She reports heightened anxiety with panic attacks, nightmares and flashbacks of PTSD type, and OCD with excessive worries cleaning, organizing, rechecking information. She has been drawing attention to her unusual behavior from family and from coworkers. She  denies symptoms suggestive of bipolar mania. She denies alcohol or illicit substance use.  Past psychiatric history. She has never been hospitalized. No history of substance use. No suicide attempts. She was treated in the past with Zoloft for depression and Concerta for ADHD. She believes that the medications were helpful. She stopped taking them one half years ago. She did see a psychiatrist at [facility] more recently but was only prescribed trazodone for sleep.  Family psychiatric history. Father with unknown mental illness and sister with bipolar.  Social history. She is married and lives with her husband. She is employed. There is no health insurance. Total Time spent with patient: 1 hour  Past Medical History:  Past Medical History  Diagnosis Date  . Diabetes mellitus without complication   . Hypertension   . Thyroid disease   . Arthritis     Past Surgical History  Procedure Laterality Date  . Appendectomy    . Knee surgery    . Cesarean section     Family History: History reviewed. No pertinent family history. Social History:  History  Alcohol Use No     History  Drug Use No    Social History   Social History  . Marital Status: Married    Spouse Name: N/A  . Number of Children: N/A  . Years of Education: N/A   Social History Main Topics  . Smoking status: Current Every Day Smoker -- 1.00 packs/day  . Smokeless tobacco: None  . Alcohol Use: No  . Drug Use: No  . Sexual Activity: Not Asked   Other Topics Concern  . None   Social History Narrative   Additional Social History:    Pain Medications: None Reported Prescriptions: None Reported Over the  Counter: None Reported History of alcohol / drug use?: No history of alcohol / drug abuse Longest period of sobriety (when/how long):  (None Reported) Negative Consequences of Use:  (None Reported) Withdrawal Symptoms:  (None Reported)                     Musculoskeletal: Strength & Muscle Tone:  within normal limits Gait & Station: normal Patient leans: N/A  Psychiatric Specialty Exam: Physical Exam  Nursing note and vitals reviewed.   Review of Systems  All other systems reviewed and are negative.   Blood pressure 122/90, pulse 93, temperature 98.8 F (37.1 C), temperature source Oral, resp. rate 18, height 5' (1.524 m), weight 76.658 kg (169 lb), SpO2 94 %.Body mass index is 33.01 kg/(m^2).  See SRA.                                                  Sleep:      Risk to Self: Is patient at risk for suicide?: No Risk to Others:   Prior Inpatient Therapy:   Prior Outpatient Therapy:    Alcohol Screening: 1. How often do you have a drink containing alcohol?: Never 2. How many drinks containing alcohol do you have on a typical day when you are drinking?: 1 or 2 3. How often do you have six or more drinks on one occasion?: Never Preliminary Score: 0 4. How often during the last year have you found that you were not able to stop drinking once you had started?: Never 5. How often during the last year have you failed to do what was normally expected from you becasue of drinking?: Never 6. How often during the last year have you needed a first drink in the morning to get yourself going after a heavy drinking session?: Never 7. How often during the last year have you had a feeling of guilt of remorse after drinking?: Never 8. How often during the last year have you been unable to remember what happened the night before because you had been drinking?: Never 9. Have you or someone else been injured as a result of your drinking?: No 10. Has a relative or friend or a doctor or another health worker been concerned about your drinking or suggested you cut down?: No Alcohol Use Disorder Identification Test Final Score (AUDIT): 0 Brief Intervention: AUDIT score less than 7 or less-screening does not suggest unhealthy drinking-brief intervention not  indicated  Allergies:   Allergies  Allergen Reactions  . Amoxicillin    Lab Results:  Results for orders placed or performed during the hospital encounter of 06/02/15 (from the past 48 hour(s))  TSH     Status: Abnormal   Collection Time: 06/02/15  6:29 PM  Result Value Ref Range   TSH 0.184 (L) 0.350 - 4.500 uIU/mL  Lipid panel, fasting     Status: None   Collection Time: 06/02/15  6:29 PM  Result Value Ref Range   Cholesterol 176 0 - 200 mg/dL   Triglycerides 144 <150 mg/dL   HDL 63 >40 mg/dL   Total CHOL/HDL Ratio 2.8 RATIO   VLDL 29 0 - 40 mg/dL   LDL Cholesterol 84 0 - 99 mg/dL    Comment:        Total Cholesterol/HDL:CHD Risk Coronary Heart Disease Risk Table  Men   Women  1/2 Average Risk   3.4   3.3  Average Risk       5.0   4.4  2 X Average Risk   9.6   7.1  3 X Average Risk  23.4   11.0        Use the calculated Patient Ratio above and the CHD Risk Table to determine the patient's CHD Risk.        ATP III CLASSIFICATION (LDL):  <100     mg/dL   Optimal  100-129  mg/dL   Near or Above                    Optimal  130-159  mg/dL   Borderline  160-189  mg/dL   High  >190     mg/dL   Very High   Comprehensive metabolic panel     Status: Abnormal   Collection Time: 06/02/15  6:29 PM  Result Value Ref Range   Sodium 134 (L) 135 - 145 mmol/L   Potassium 3.4 (L) 3.5 - 5.1 mmol/L   Chloride 99 (L) 101 - 111 mmol/L   CO2 25 22 - 32 mmol/L   Glucose, Bld 129 (H) 65 - 99 mg/dL   BUN 15 6 - 20 mg/dL   Creatinine, Ser 0.73 0.44 - 1.00 mg/dL   Calcium 8.9 8.9 - 10.3 mg/dL   Total Protein 7.5 6.5 - 8.1 g/dL   Albumin 4.2 3.5 - 5.0 g/dL   AST 19 15 - 41 U/L   ALT 13 (L) 14 - 54 U/L   Alkaline Phosphatase 89 38 - 126 U/L   Total Bilirubin 0.7 0.3 - 1.2 mg/dL   GFR calc non Af Amer >60 >60 mL/min   GFR calc Af Amer >60 >60 mL/min    Comment: (NOTE) The eGFR has been calculated using the CKD EPI equation. This calculation has not been validated  in all clinical situations. eGFR's persistently <60 mL/min signify possible Chronic Kidney Disease.    Anion gap 10 5 - 15  CBC     Status: Abnormal   Collection Time: 06/02/15  6:29 PM  Result Value Ref Range   WBC 11.0 3.6 - 11.0 K/uL   RBC 5.29 (H) 3.80 - 5.20 MIL/uL   Hemoglobin 16.0 12.0 - 16.0 g/dL   HCT 47.4 (H) 35.0 - 47.0 %   MCV 89.5 80.0 - 100.0 fL   MCH 30.2 26.0 - 34.0 pg   MCHC 33.7 32.0 - 36.0 g/dL   RDW 13.3 11.5 - 14.5 %   Platelets 238 150 - 440 K/uL  Ethanol     Status: None   Collection Time: 06/02/15  6:29 PM  Result Value Ref Range   Alcohol, Ethyl (B) <5 <5 mg/dL    Comment:        LOWEST DETECTABLE LIMIT FOR SERUM ALCOHOL IS 5 mg/dL FOR MEDICAL PURPOSES ONLY    Current Medications: Current Facility-Administered Medications  Medication Dose Route Frequency Provider Last Rate Last Dose  . acetaminophen (TYLENOL) tablet 650 mg  650 mg Oral Q6H PRN Hildred Priest, MD      . alum & mag hydroxide-simeth (MAALOX/MYLANTA) 200-200-20 MG/5ML suspension 30 mL  30 mL Oral Q4H PRN Hildred Priest, MD      . ibuprofen (ADVIL,MOTRIN) tablet 600 mg  600 mg Oral Q6H PRN Hildred Priest, MD      . levothyroxine (SYNTHROID, LEVOTHROID) tablet 250 mcg  250 mcg Oral QAC  breakfast Hildred Priest, MD   250 mcg at 06/03/15 0912  . LORazepam (ATIVAN) tablet 2 mg  2 mg Oral TID PRN Hildred Priest, MD   2 mg at 06/02/15 2003  . magnesium hydroxide (MILK OF MAGNESIA) suspension 30 mL  30 mL Oral Daily PRN Hildred Priest, MD      . nicotine (NICODERM CQ - dosed in mg/24 hours) patch 21 mg  21 mg Transdermal Q0600 Hildred Priest, MD   21 mg at 06/03/15 0912  . OLANZapine (ZYPREXA) injection 10 mg  10 mg Intramuscular BID PRN Hildred Priest, MD      . OLANZapine (ZYPREXA) tablet 10 mg  10 mg Oral TID PRN Hildred Priest, MD      . traZODone (DESYREL) tablet 100 mg  100 mg Oral QHS PRN  Hildred Priest, MD       PTA Medications: Prescriptions prior to admission  Medication Sig Dispense Refill Last Dose  . levothyroxine (SYNTHROID, LEVOTHROID) 100 MCG tablet Take 250 mcg by mouth daily before breakfast.     . oxyCODONE-acetaminophen (PERCOCET/ROXICET) 5-325 MG per tablet Take 1 tablet by mouth every 4 (four) hours as needed for severe pain. 6 tablet 0   . oxyCODONE-acetaminophen (PERCOCET/ROXICET) 5-325 MG per tablet Take 1 tablet by mouth every 4 (four) hours as needed for severe pain. 20 tablet 0     Previous Psychotropic Medications: Yes   Substance Abuse History in the last 12 months:  No.    Consequences of Substance Abuse: NA  Results for orders placed or performed during the hospital encounter of 06/02/15 (from the past 72 hour(s))  TSH     Status: Abnormal   Collection Time: 06/02/15  6:29 PM  Result Value Ref Range   TSH 0.184 (L) 0.350 - 4.500 uIU/mL  Lipid panel, fasting     Status: None   Collection Time: 06/02/15  6:29 PM  Result Value Ref Range   Cholesterol 176 0 - 200 mg/dL   Triglycerides 144 <150 mg/dL   HDL 63 >40 mg/dL   Total CHOL/HDL Ratio 2.8 RATIO   VLDL 29 0 - 40 mg/dL   LDL Cholesterol 84 0 - 99 mg/dL    Comment:        Total Cholesterol/HDL:CHD Risk Coronary Heart Disease Risk Table                     Men   Women  1/2 Average Risk   3.4   3.3  Average Risk       5.0   4.4  2 X Average Risk   9.6   7.1  3 X Average Risk  23.4   11.0        Use the calculated Patient Ratio above and the CHD Risk Table to determine the patient's CHD Risk.        ATP III CLASSIFICATION (LDL):  <100     mg/dL   Optimal  100-129  mg/dL   Near or Above                    Optimal  130-159  mg/dL   Borderline  160-189  mg/dL   High  >190     mg/dL   Very High   Comprehensive metabolic panel     Status: Abnormal   Collection Time: 06/02/15  6:29 PM  Result Value Ref Range   Sodium 134 (L) 135 - 145 mmol/L   Potassium 3.4 (L) 3.5 -  5.1 mmol/L   Chloride 99 (L) 101 - 111 mmol/L   CO2 25 22 - 32 mmol/L   Glucose, Bld 129 (H) 65 - 99 mg/dL   BUN 15 6 - 20 mg/dL   Creatinine, Ser 0.73 0.44 - 1.00 mg/dL   Calcium 8.9 8.9 - 10.3 mg/dL   Total Protein 7.5 6.5 - 8.1 g/dL   Albumin 4.2 3.5 - 5.0 g/dL   AST 19 15 - 41 U/L   ALT 13 (L) 14 - 54 U/L   Alkaline Phosphatase 89 38 - 126 U/L   Total Bilirubin 0.7 0.3 - 1.2 mg/dL   GFR calc non Af Amer >60 >60 mL/min   GFR calc Af Amer >60 >60 mL/min    Comment: (NOTE) The eGFR has been calculated using the CKD EPI equation. This calculation has not been validated in all clinical situations. eGFR's persistently <60 mL/min signify possible Chronic Kidney Disease.    Anion gap 10 5 - 15  CBC     Status: Abnormal   Collection Time: 06/02/15  6:29 PM  Result Value Ref Range   WBC 11.0 3.6 - 11.0 K/uL   RBC 5.29 (H) 3.80 - 5.20 MIL/uL   Hemoglobin 16.0 12.0 - 16.0 g/dL   HCT 47.4 (H) 35.0 - 47.0 %   MCV 89.5 80.0 - 100.0 fL   MCH 30.2 26.0 - 34.0 pg   MCHC 33.7 32.0 - 36.0 g/dL   RDW 13.3 11.5 - 14.5 %   Platelets 238 150 - 440 K/uL  Ethanol     Status: None   Collection Time: 06/02/15  6:29 PM  Result Value Ref Range   Alcohol, Ethyl (B) <5 <5 mg/dL    Comment:        LOWEST DETECTABLE LIMIT FOR SERUM ALCOHOL IS 5 mg/dL FOR MEDICAL PURPOSES ONLY     Observation Level/Precautions:  15 minute checks  Laboratory:  CBC Chemistry Profile UDS UA  Psychotherapy:    Medications:    Consultations:    Discharge Concerns:    Estimated LOS:  Other:     Psychological Evaluations: No   Treatment Plan Summary: Daily contact with patient to assess and evaluate symptoms and progress in treatment and Medication management  Medical Decision Making:  New problem, with additional work up planned, Review of Psycho-Social Stressors (1), Review or order clinical lab tests (1), Review of Medication Regimen & Side Effects (2) and Review of New Medication or Change in Dosage (2)    Ms. Ende is a 49 year old female with a history of depression, anxiety, and ADHD admitted for worsening of depression and paranoia.  1. Mood and psychosis. We will start Risperdal at night for psychosis and Lovenox for depression, anxiety of OCD type.  2. Anxiety. The patient reports panic attacks, OCD and PTSD type symptoms. We will start Luvox. We will offer Restoril nightmares and flashbacks. We'll offer low dose Ativan while the hospital.  3. Hypothyroidism. We'll continue Synthroid  4. Smoking. Nicotine products are available.  5. Disposition. She will be discharged to home with family. She will follow-up with [facility].   I certify that inpatient services furnished can reasonably be expected to improve the patient's condition.   Alondria Mousseau 8/18/201611:02 AM

## 2015-06-03 NOTE — [facility] Suicide Risk Assessment (Signed)
[facility] Admission Suicide Risk Assessment   Nursing information obtained from:    Demographic factors:    Current Mental Status:    Loss Factors:    Historical Factors:    Risk Reduction Factors:    Total Time spent with patient: 1 hour Principal Problem: Major depressive disorder, recurrent episode, severe with mood-congruent psychotic features Diagnosis:   Patient Active Problem List   Diagnosis Date Noted  . Major depressive disorder, recurrent episode, severe with mood-congruent psychotic features [F33.3] 06/03/2015  . HTN (hypertension) [I10] 06/02/2015  . Hypothyroidism [E03.9] 06/02/2015  . Tobacco use disorder [Z72.0] 06/02/2015     Continued Clinical Symptoms:  Alcohol Use Disorder Identification Test Final Score (AUDIT): 0 The "Alcohol Use Disorders Identification Test", Guidelines for Use in Primary Care, Second Edition.  World Science writer [facility]). Score between 0-7:  no or low risk or alcohol related problems. Score between 8-15:  moderate risk of alcohol related problems. Score between 16-19:  high risk of alcohol related problems. Score 20 or above:  warrants further diagnostic evaluation for alcohol dependence and treatment.   CLINICAL FACTORS:   Depression:   Anhedonia Delusional Insomnia Severe Currently Psychotic   Musculoskeletal: Strength & Muscle Tone: within normal limits Gait & Station: normal Patient leans: N/A  Psychiatric Specialty Exam: Physical Exam  Nursing note and vitals reviewed. Constitutional: She is oriented to person, place, and time. She appears well-developed and well-nourished.  HENT:  Head: Normocephalic and atraumatic.  Eyes: Conjunctivae and EOM are normal. Pupils are equal, round, and reactive to light.  Neck: Normal range of motion. Neck supple.  Cardiovascular: Normal rate, regular rhythm and normal heart sounds.   Respiratory: Effort normal and breath sounds normal.  GI: Soft. Bowel sounds are normal.   Musculoskeletal: Normal range of motion.  Neurological: She is alert and oriented to person, place, and time.  Skin: Skin is warm and dry.    Review of Systems  All other systems reviewed and are negative.   Blood pressure 122/90, pulse 93, temperature 98.8 F (37.1 C), temperature source Oral, resp. rate 18, height 5' (1.524 m), weight 76.658 kg (169 lb), SpO2 94 %.Body mass index is 33.01 kg/(m^2).  General Appearance: Casual  Eye Contact::  Good  Speech:  Clear and Coherent  Volume:  Normal  Mood:  Dysphoric and Hopeless  Affect:  Tearful  Thought Process:  Goal Directed  Orientation:  Full (Time, Place, and Person)  Thought Content:  Delusions, Hallucinations: Auditory Visual, Obsessions, Paranoid Ideation and Rumination  Suicidal Thoughts:  No  Homicidal Thoughts:  No  Memory:  Immediate;   Fair Recent;   Fair Remote;   Fair  Judgement:  Fair  Insight:  Fair  Psychomotor Activity:  Normal  Concentration:  Fair  Recall:  [employer] of Knowledge:Fair  Language: Fair  Akathisia:  No  Handed:  Right  AIMS (if indicated):     Assets:  Communication Skills Desire for Improvement Housing Physical Health Social Support  Sleep:     Cognition: WNL  ADL's:  Intact     COGNITIVE FEATURES THAT CONTRIBUTE TO RISK:  None    SUICIDE RISK:   Moderate:  Frequent suicidal ideation with limited intensity, and duration, some specificity in terms of plans, no associated intent, good self-control, limited dysphoria/symptomatology, some risk factors present, and identifiable protective factors, including available and accessible social support.  PLAN OF CARE: hospital admission, medication management, discharge planning.  Medical Decision Making:  New problem, with additional work  up planned, Review of Psycho-Social Stressors (1), Review or order clinical lab tests (1), Review of Medication Regimen & Side Effects (2) and Review of New Medication or Change in Dosage (2)   Ms.  Espericueta is a 49 year old female with a history of depression, anxiety, and ADHD admitted for worsening of depression and paranoia.  1. Mood and psychosis. We will start Risperdal at night for psychosis and Lovenox for depression, anxiety of OCD type.  2. Anxiety. The patient reports panic attacks, OCD and PTSD type symptoms. We will start Luvox. We will offer Restoril nightmares and flashbacks. We'll offer low dose Ativan while the hospital.  3. Hypothyroidism. We'll continue Synthroid  4. Smoking. Nicotine products are available.  5. Disposition. She will be discharged to home with family. She will follow-up with [facility].  I certify that inpatient services furnished can reasonably be expected to improve the patient's condition.   Savanna Dooley 06/03/2015, 10:55 AM
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
## 2015-06-03 NOTE — Progress Notes (Signed)
D: Patient denies SI/HI/AVH. Patient affect is flat and her mood is depressed. Patient did attend  group. Patient visible on the milieu. No distress noted. A: Support and encouragement offered. Scheduled medications given to pt. Q 15 min checks continued for patient safety. R: Patient receptive. Patient remains safe on the unit.

## 2015-06-03 NOTE — BHH Counselor (Signed)
Authorization Request for Psych Inpt. Treatment from [employer] completed and submitted. [id number].

## 2015-06-03 NOTE — BHH Group Notes (Signed)
BHH LCSW Group Therapy  06/03/2015 3:27 PM  Type of Therapy:  Group Therapy  Participation Level:  Active  Participation Quality:  Attentive  Affect:  Appropriate  Cognitive:  Alert  Insight:  Improving  Engagement in Therapy:  Improving  Modes of Intervention:  Discussion, Education, Socialization and Support  Summary of Progress/Problems: Balance in life: Patients will discuss the concept of balance and how it looks and feels to be unbalanced. Pt will identify areas in their life that is unbalanced and ways to become more balanced.  Amber Acevedo states she does not feel as paranoid today. She identified relationships with family as stressors. To regain balance, she wants to begin meditating and exercising. She says it is important for her to laugh because it helps to relieve stress.   [employer] MSW, LCSWA  06/03/2015, 3:27 PM

## 2015-06-03 NOTE — Progress Notes (Signed)
Recreation Therapy Notes  INPATIENT RECREATION THERAPY ASSESSMENT  Patient Details Name: ROBBY PIRANI MRN: [id number] DOB: [birth year] Today's Date: 06/03/2015  Patient Stressors: Family, Relationship, Other (Comment) (Wondering why she is acting the way she is)  Coping Skills:   Isolate, Arguments, Avoidance, Exercise, Art/Dance, Talking, Music, Sports, Other (Comment) (Deep breathing)  Personal Challenges: Anger, Communication, Concentration, Decision-Making, Expressing Yourself, Problem-Solving, Relationships, Self-Esteem/Confidence, Social Interaction, Stress Management, Time Management, Trusting Others  Leisure Interests (2+):  Individual - Other (Comment) (Car dance, sit and watch family interact with each other)  Awareness of Community Resources:  Yes  Community Resources:  [employer], Park, Other (Comment) (Trails)  Current Use: No  If no, Barriers?: Other (Comment) (Conserve energy - does things about the house and has to be careful with hip)  Patient Strengths:  "Right now I really don't have anything I love about me."  Patient Identified Areas of Improvement:  Communication, being a good person  Current Recreation Participation:  Nothing  Patient Goal for Hospitalization:  To find out what is wrong and ways to correct it  [place] of Residence:  [place] of Residence:  [place]   Current [place] (including self-harm):  No ("Not at the moment no")  Current HI:  No  Consent to Intern Participation: N/A   Jacquelynn Cree, LRT/CTRS 06/03/2015, 12:31 PM

## 2015-06-03 NOTE — Plan of Care (Signed)
Problem: Ineffective individual coping Goal: STG: Pt will be able to identify effective and ineffective STG: Pt will be able to identify effective and ineffective coping patterns  Outcome: Not Progressing Pt continues to be confused about delusions

## 2015-06-03 NOTE — Progress Notes (Signed)
Patient with increasing anxiety, sitting in room in chair, holding pillow and rocking back and forth. Patient states "I think I hurt my family". Extended one on one with patient. Administered Ativan prn and educated patient on deep breathing and muscle relaxation with good effect. Family into visit and safety maintained.

## 2015-06-04 MED ORDER — FLUVOXAMINE MALEATE 50 MG PO TABS
100.0000 mg | ORAL_TABLET | Freq: Every day | ORAL | Status: DC
  Administered 2015-06-04 – 2015-06-06 (×3): 100 mg via ORAL
  Filled 2015-06-04 (×3): qty 2

## 2015-06-04 MED ORDER — PRAZOSIN HCL 2 MG PO CAPS
2.0000 mg | ORAL_CAPSULE | Freq: Two times a day (BID) | ORAL | Status: DC
  Administered 2015-06-04 – 2015-06-07 (×6): 2 mg via ORAL
  Filled 2015-06-04: qty 2
  Filled 2015-06-04: qty 1
  Filled 2015-06-04: qty 2
  Filled 2015-06-04 (×2): qty 1
  Filled 2015-06-04: qty 2
  Filled 2015-06-04 (×4): qty 1

## 2015-06-04 NOTE — BHH Group Notes (Signed)
[facility] LCSW Aftercare Discharge Planning Group Note   06/04/2015 11:30 AM  Participation Quality:  Minimal   Mood/Affect:  Depressed and Tearful  Depression Rating:  10  Anxiety Rating:  10  Thoughts of Suicide:  No Will you contract for safety?   NA  Current AVH:  No  Plan for Discharge/Comments:  Pt states she is unsure where she is going to stay upon discharge. She believes she has caused her family too much pain so they might not want her to come back. She states she would like to return home if possible. She states she is feel worse today than yesterday. She states negative thoughts are worsening.   Transportation Means: family   Supports: family   Garment/textile technologist MSW, [address]

## 2015-06-04 NOTE — BHH Group Notes (Signed)
BHH Group Notes:  (Nursing/MHT/Case Management/Adjunct)  Date:  06/04/2015  Time:  12:45 PM  Type of Therapy:  Group Therapy  Participation Level:  Active  Participation Quality:  Appropriate  Affect:  Appropriate  Cognitive:  Appropriate  Insight:  Good  Engagement in Group:  Engaged  Modes of Intervention:  Support  Summary of Progress/Problems:  Amber Acevedo 06/04/2015, 12:45 PM

## 2015-06-04 NOTE — Progress Notes (Signed)
Recreation Therapy Notes  Date: 08.19.16 Time: 3:00 pm Location: Craft Room  Group Topic: Problem solving, communication, teamwork  Goal Area(s) Addresses:  Patient will work in teams towards shared goal. Patient will verbalize skills needed to make activity successful. Patient will verbalize benefit of using skills identified to reach post d/c goals.  Behavioral Response: Attentive, Interactive   Intervention: Landing Pad  Activity: Patients were divided into two teams and given 15 straws and approximately 2.5 feet of tape. Patients  instructed to build a landing pad to catch a golf ball that would be dropped from approximately 4 feet.  Education: LRT educated patients on why communication, problem solving, and teamwork is important.  Education Outcome: Acknowledges education/In group clarification offered  Clinical Observations/Feedback: Patient worked with team to build landing pad. Patient used effective communication, problem solving, and teamwork skills. Patient contributed to group discussion by stating what skills were used during group.  Jacquelynn Cree, LRT/CTRS 06/04/2015 4:59 PM

## 2015-06-04 NOTE — Progress Notes (Signed)
Patient states she was feeling sad and depressed this morning but is feeling better this evening. States that groups have helped her. Reports still having some delusional thoughts but she is using skills from groups to cope with them. Denies SI/HI/AVH. Will continue to monitor.

## 2015-06-04 NOTE — Plan of Care (Signed)
Problem: Ineffective individual coping Goal: LTG: Patient will report a decrease in negative feelings Pt stated she felt "better" tonight Goal: STG: Patient will remain free from self harm Outcome: Progressing Pt safe on the unit at this time
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
## 2015-06-04 NOTE — Progress Notes (Signed)
[facility] MD Progress Note  06/04/2015 8:08 PM Amber Acevedo  MRN:  [id number]  Subjective:  Amber Acevedo still depressed, tearful, paranoid, and delusional. She started medications last night. She seems to tolerate them well. There are no somatic symptoms. Sleep and appetite are fair. She has been participating in groups learning coping skills. She had visitors tonight. The visit went well even though the patient thinks that her family and friends are conspiring to kill her.  Principal Problem: Major depressive disorder, recurrent episode, severe with mood-congruent psychotic features Diagnosis:   Patient Active Problem List   Diagnosis Date Noted  . Major depressive disorder, recurrent episode, severe with mood-congruent psychotic features [F33.3] 06/03/2015  . HTN (hypertension) [I10] 06/02/2015  . Hypothyroidism [E03.9] 06/02/2015  . Tobacco use disorder [Z72.0] 06/02/2015   Total Time spent with patient: 20 minutes   Past Medical History:  Past Medical History  Diagnosis Date  . Diabetes mellitus without complication   . Hypertension   . Thyroid disease   . Arthritis     Past Surgical History  Procedure Laterality Date  . Appendectomy    . Knee surgery    . Cesarean section     Family History: History reviewed. No pertinent family history. Social History:  History  Alcohol Use No     History  Drug Use No    Social History   Social History  . Marital Status: Married    Spouse Name: N/A  . Number of Children: N/A  . Years of Education: N/A   Social History Main Topics  . Smoking status: Current Every Day Smoker -- 1.00 packs/day  . Smokeless tobacco: None  . Alcohol Use: No  . Drug Use: No  . Sexual Activity: Not Asked   Other Topics Concern  . None   Social History Narrative   Additional History:    Sleep: Fair  Appetite:  Fair   Assessment:   Musculoskeletal: Strength & Muscle Tone: within normal limits Gait & Station: normal Patient leans:  N/A   Psychiatric Specialty Exam: Physical Exam  Nursing note and vitals reviewed.   Review of Systems  All other systems reviewed and are negative.   Blood pressure 137/83, pulse 101, temperature 98.4 F (36.9 C), temperature source Oral, resp. rate 20, height 5' (1.524 m), weight 76.658 kg (169 lb), SpO2 94 %.Body mass index is 33.01 kg/(m^2).  General Appearance: Casual  Eye Contact::  Fair  Speech:  Normal Rate  Volume:  Normal  Mood:  Anxious and Depressed  Affect:  Tearful  Thought Process:  Goal Directed  Orientation:  Full (Time, Place, and Person)  Thought Content:  Delusions, Obsessions and Paranoid Ideation  Suicidal Thoughts:  No  Homicidal Thoughts:  No  Memory:  Immediate;   Fair Recent;   Fair Remote;   Fair  Judgement:  Fair  Insight:  Fair  Psychomotor Activity:  Normal  Concentration:  Fair  Recall:  [employer] of Knowledge:Fair  Language: Fair  Akathisia:  No  Handed:  Right  AIMS (if indicated):     Assets:  Communication Skills Desire for Improvement  ADL's:  Intact  Cognition: WNL  Sleep:  Number of Hours: 6.3     Current Medications: Current Facility-Administered Medications  Medication Dose Route Frequency Provider Last Rate Last Dose  . acetaminophen (TYLENOL) tablet 650 mg  650 mg Oral Q6H PRN Jimmy Footman, MD      . alum & mag hydroxide-simeth (MAALOX/MYLANTA) 200-200-20 MG/5ML suspension 30  mL  30 mL Oral Q4H PRN Jimmy Footman, MD      . fluvoxaMINE (LUVOX) tablet 100 mg  100 mg Oral QHS Jolanta B Pucilowska, MD      . haloperidol (HALDOL) tablet 2 mg  2 mg Oral QHS Jolanta B Pucilowska, MD   2 mg at 06/03/15 2119  . ibuprofen (ADVIL,MOTRIN) tablet 600 mg  600 mg Oral Q6H PRN Jimmy Footman, MD      . levothyroxine (SYNTHROID, LEVOTHROID) tablet 250 mcg  250 mcg Oral QAC breakfast Jimmy Footman, MD   250 mcg at 06/04/15 0805  . LORazepam (ATIVAN) tablet 0.5 mg  0.5 mg Oral Q6H PRN Shari Prows, MD   0.5 mg at 06/03/15 1806  . magnesium hydroxide (MILK OF MAGNESIA) suspension 30 mL  30 mL Oral Daily PRN Jimmy Footman, MD      . nicotine (NICODERM CQ - dosed in mg/24 hours) patch 21 mg  21 mg Transdermal Q0600 Jimmy Footman, MD   21 mg at 06/04/15 0808  . prazosin (MINIPRESS) capsule 2 mg  2 mg Oral BID Shari Prows, MD      . traZODone (DESYREL) tablet 100 mg  100 mg Oral QHS PRN Jimmy Footman, MD   100 mg at 06/03/15 2119    Lab Results:  Results for orders placed or performed during the hospital encounter of 06/02/15 (from the past 48 hour(s))  Pregnancy, urine     Status: None   Collection Time: 06/03/15 11:02 AM  Result Value Ref Range   Preg Test, Ur NEGATIVE NEGATIVE  Urine Drug Screen, Qualitative ([facility] only)     Status: None   Collection Time: 06/03/15 11:02 AM  Result Value Ref Range   Tricyclic, Ur Screen NONE DETECTED NONE DETECTED   Amphetamines, Ur Screen NONE DETECTED NONE DETECTED   MDMA (Ecstasy)Ur Screen NONE DETECTED NONE DETECTED   Cocaine Metabolite,Ur [place] NONE DETECTED NONE DETECTED   Opiate, Ur Screen NONE DETECTED NONE DETECTED   Phencyclidine (PCP) Ur S NONE DETECTED NONE DETECTED   Cannabinoid 50 Ng, Ur  NONE DETECTED NONE DETECTED   Barbiturates, Ur Screen NONE DETECTED NONE DETECTED   Benzodiazepine, Ur Scrn NONE DETECTED NONE DETECTED   Methadone Scn, Ur NONE DETECTED NONE DETECTED    Comment: (NOTE) 100  Tricyclics, urine               Cutoff 1000 ng/mL 200  Amphetamines, urine             Cutoff 1000 ng/mL 300  MDMA (Ecstasy), urine           Cutoff 500 ng/mL 400  Cocaine Metabolite, urine       Cutoff 300 ng/mL 500  Opiate, urine                   Cutoff 300 ng/mL 600  Phencyclidine (PCP), urine      Cutoff 25 ng/mL 700  Cannabinoid, urine              Cutoff 50 ng/mL 800  Barbiturates, urine             Cutoff 200 ng/mL 900  Benzodiazepine, urine           Cutoff 200 ng/mL 1000  Methadone, urine                Cutoff 300 ng/mL 1100 1200 The urine drug screen provides only a preliminary, unconfirmed 1300 analytical test result and should not  be used for non-medical 1400 purposes. Clinical consideration and professional judgment should 1500 be applied to any positive drug screen result due to possible 1600 interfering substances. A more specific alternate chemical method 1700 must be used in order to obtain a confirmed analytical result.  1800 Gas chromato graphy / mass spectrometry (GC/MS) is the preferred 1900 confirmatory method.   Urinalysis complete, with microscopic ([facility] only)     Status: Abnormal   Collection Time: 06/03/15 11:02 AM  Result Value Ref Range   Color, Urine YELLOW (A) YELLOW   APPearance CLEAR (A) CLEAR   Glucose, UA NEGATIVE NEGATIVE mg/dL   Bilirubin Urine NEGATIVE NEGATIVE   Ketones, ur NEGATIVE NEGATIVE mg/dL   Specific Gravity, Urine 1.009 1.005 - 1.030   Hgb urine dipstick 1+ (A) NEGATIVE   pH 7.0 5.0 - 8.0   Protein, ur NEGATIVE NEGATIVE mg/dL   Nitrite NEGATIVE NEGATIVE   Leukocytes, UA NEGATIVE NEGATIVE   RBC / HPF 0-5 0 - 5 RBC/hpf   WBC, UA 0-5 0 - 5 WBC/hpf   Bacteria, UA RARE (A) NONE SEEN   Squamous Epithelial / LPF 0-5 (A) NONE SEEN   Mucous PRESENT     Physical Findings: AIMS:  , ,  ,  ,    CIWA:    COWS:     Treatment Plan Summary: Daily contact with patient to assess and evaluate symptoms and progress in treatment and Medication management   Medical Decision Making:  Established Problem, Stable/Improving (1), Review of Psycho-Social Stressors (1), Review or order clinical lab tests (1), Review of Medication Regimen & Side Effects (2) and Review of New Medication or Change in Dosage (2)   Amber Acevedo is a 49 year old female with a history of depression, anxiety, and ADHD admitted for worsening of depression and paranoia.  1. Mood and psychosis. We will start Haldol at night for psychosis and Luvox for  depression and  anxiety of OCD type.  2. Anxiety. The patient reports panic attacks, OCD and PTSD type symptoms. We started Luvox and will increase dose to 100 mg tonight. We will offer Minipress for nightmares and flashbacks. We'll offer low dose Ativan while in the hospital.  3. Hypothyroidism. We'll continue Synthroid  4. Smoking. Nicotine products are available.  5. Disposition. She will be discharged to home with family. She will follow-up with [facility].     Jolanta Pucilowska 06/04/2015, 8:08 PM

## 2015-06-04 NOTE — Progress Notes (Signed)
D: Pt denies SI/HI/AVH. Pt is pleasant and cooperative. Pt flat and blunted upon approach. Pt stated she was feeling a little better . Pt said she felt "calm".   A: Pt was offered support and encouragement. Pt was given scheduled medications. Pt was encourage to attend groups. Q 15 minute checks were done for safety.   R:Pt attends groups and interacts well with peers and staff. Pt is taking medication. Pt has no complaints at this time .Pt receptive to treatment and safety maintained on unit.

## 2015-06-04 NOTE — Plan of Care (Signed)
Problem: [facility] Participation in Recreation Therapeutic Interventions Goal: STG-Patient will demonstrate improved self esteem by identif STG: Self-Esteem - Within 4 treatment sessions, patient will verbalize at least 5 positive affirmation statements in each of 2 treatment sessions to increase self-esteem post d/c.  Outcome: Progressing Treatment Session 1; Completed 1 out of 2: At approximately 4:00 pm, LRT met with patient in craft room. Patient verbalized 5 positive affirmation statements. Patient reported it felt "a touch lying to myself". LRT encouraged patient to continue saying positive affirmation statements.  Leonette Monarch, LRT/CTRS 08.19.16 5:10 pm Goal: STG-Patient will demonstrate improved communication skills b STG: Communication - Within 3 treatment sessions, patient will participate in communication activity in one treatment session to increase healthy communication skills post d/c.  Outcome: Completed/Met Date Met:  06/04/15 Treatment Session 1; Completed 1 out of 1: At approximately 4:00 pm, LRT met with patient in craft room. Patient was instructed to take as much toilet paper as she would use in 2 hours. For each square, patient was instructed to give a fun fact about herself. Patient able to give fun facts for each square. LRT educated patient on communication and trust.  Leonette Monarch, LRT/CTRS 08.19.16 5:12 pm Goal: STG-Other Recreation Therapy Goal (Specify) STG: Stress Management - Within 5 treatment sessions, patient will demonstrate at least one stress management technique in one treatment session to increase stress management techniques post d/c.  Outcome: Progressing Treatment Session 1; Completed 0 out of 1: At approximately 4:00 pm, LRT met with patient in craft room. LRT educated and provided patient with handouts on stress management techniques. Patient verbalized understanding. LRT encouraged patient to read over and practice the stress management  techniques.  Leonette Monarch, LRT/CTRS 08.19.16 5:14 pm

## 2015-06-05 DIAGNOSIS — F333 Major depressive disorder, recurrent, severe with psychotic symptoms: Principal | ICD-10-CM

## 2015-06-05 MED ORDER — NICOTINE 21 MG/24HR TD PT24: 21.0000 mg | MEDICATED_PATCH | Freq: Every morning | TRANSDERMAL | Status: DC

## 2015-06-05 MED ORDER — NICOTINE POLACRILEX 2 MG MT GUM: 2.0000 mg | CHEWING_GUM | OROMUCOSAL | Status: DC | PRN

## 2015-06-05 NOTE — Progress Notes (Signed)
D: Pt is awake and active in the milieu this evening. Pt mood is anxious and her affect is sad/bizarre. Pt is very cautious and guarded on the unit but is present in the dayroom with her peers. Pt was concerned that she had broken pt confidentiality by telling family that we have substance abuse patients on the unit, although she was not specific. She also endorses AH at times but is not suicidal.  A: Writer provided emotional support and administered medications as prescribed. Writer assured pt that she had not revealed private health hx of others, and did not need to worry.  R: Pt was relieved and had no further complaints. Pt went to bed following medication administration.

## 2015-06-05 NOTE — Progress Notes (Signed)
[facility] MD Progress Note  06/05/2015 12:36 PM Amber Acevedo  MRN:  [id number]  Subjective:  Patient is still a little bit confused but she is a little bit more calm today. Still seems a bit paranoid. Rambles a bit in speech but is not hyperactive and not threatening. Insight is a little bit better. No change to psychiatric treatment plan. Seems to be improving. She has a clear rash from her nicotine patch. That will be discontinued and substituted with nicotine gum.  Principal Problem: Major depressive disorder, recurrent episode, severe with mood-congruent psychotic features Diagnosis:   Patient Active Problem List   Diagnosis Date Noted  . Major depressive disorder, recurrent episode, severe with mood-congruent psychotic features [F33.3] 06/03/2015  . HTN (hypertension) [I10] 06/02/2015  . Hypothyroidism [E03.9] 06/02/2015  . Tobacco use disorder [Z72.0] 06/02/2015   Total Time spent with patient: 20 minutes   Past Medical History:  Past Medical History  Diagnosis Date  . Diabetes mellitus without complication   . Hypertension   . Thyroid disease   . Arthritis     Past Surgical History  Procedure Laterality Date  . Appendectomy    . Knee surgery    . Cesarean section     Family History: History reviewed. No pertinent family history. Social History:  History  Alcohol Use No     History  Drug Use No    Social History   Social History  . Marital Status: Married    Spouse Name: N/A  . Number of Children: N/A  . Years of Education: N/A   Social History Main Topics  . Smoking status: Current Every Day Smoker -- 1.00 packs/day  . Smokeless tobacco: None  . Alcohol Use: No  . Drug Use: No  . Sexual Activity: Not Asked   Other Topics Concern  . None   Social History Narrative   Additional History:    Sleep: Fair  Appetite:  Fair   Assessment:   Musculoskeletal: Strength & Muscle Tone: within normal limits Gait & Station: normal Patient leans:  N/A   Psychiatric Specialty Exam: Physical Exam  Nursing note and vitals reviewed. Constitutional: She appears well-developed and well-nourished.  HENT:  Head: Normocephalic and atraumatic.  Eyes: Conjunctivae are normal. Pupils are equal, round, and reactive to light.  Neck: Normal range of motion.  Cardiovascular: Normal heart sounds.   Respiratory: Effort normal.  GI: Soft.  Musculoskeletal: Normal range of motion.  Neurological: She is alert.  Skin: Skin is warm and dry.  Psychiatric: Her speech is normal and behavior is normal. Her mood appears anxious. Thought content is paranoid. Cognition and memory are normal. She expresses impulsivity. She exhibits a depressed mood.  Patient appears calmer then she was described as being earlier. Good eye contact. Still seems a little paranoid and confused.    Review of Systems  Constitutional: Negative.   HENT: Negative.   Eyes: Negative.   Respiratory: Negative.   Cardiovascular: Negative.   Gastrointestinal: Negative.   Musculoskeletal: Negative.   Skin: Positive for rash.  Neurological: Negative.   All other systems reviewed and are negative.   Blood pressure 124/84, pulse 102, temperature 98.5 F (36.9 C), temperature source Oral, resp. rate 18, height 5' (1.524 m), weight 76.658 kg (169 lb), SpO2 94 %.Body mass index is 33.01 kg/(m^2).  General Appearance: Casual  Eye Contact::  Fair  Speech:  Normal Rate  Volume:  Normal  Mood:  Anxious and Depressed  Affect:  Tearful  Thought Process:  Goal Directed  Orientation:  Full (Time, Place, and Person)  Thought Content:  Delusions, Obsessions and Paranoid Ideation  Suicidal Thoughts:  No  Homicidal Thoughts:  No  Memory:  Immediate;   Fair Recent;   Fair Remote;   Fair  Judgement:  Fair  Insight:  Fair  Psychomotor Activity:  Normal  Concentration:  Fair  Recall:  [employer] of Knowledge:Fair  Language: Fair  Akathisia:  No  Handed:  Right  AIMS (if indicated):      Assets:  Communication Skills Desire for Improvement  ADL's:  Intact  Cognition: WNL  Sleep:  Number of Hours: 6.75     Current Medications: Current Facility-Administered Medications  Medication Dose Route Frequency Provider Last Rate Last Dose  . acetaminophen (TYLENOL) tablet 650 mg  650 mg Oral Q6H PRN Jimmy Footman, MD      . alum & mag hydroxide-simeth (MAALOX/MYLANTA) 200-200-20 MG/5ML suspension 30 mL  30 mL Oral Q4H PRN Jimmy Footman, MD      . fluvoxaMINE (LUVOX) tablet 100 mg  100 mg Oral QHS Jolanta B Pucilowska, MD   100 mg at 06/04/15 2103  . haloperidol (HALDOL) tablet 2 mg  2 mg Oral QHS Shari Prows, MD   2 mg at 06/04/15 2103  . ibuprofen (ADVIL,MOTRIN) tablet 600 mg  600 mg Oral Q6H PRN Jimmy Footman, MD      . levothyroxine (SYNTHROID, LEVOTHROID) tablet 250 mcg  250 mcg Oral QAC breakfast Jimmy Footman, MD   250 mcg at 06/05/15 1610  . LORazepam (ATIVAN) tablet 0.5 mg  0.5 mg Oral Q6H PRN Shari Prows, MD   0.5 mg at 06/03/15 1806  . magnesium hydroxide (MILK OF MAGNESIA) suspension 30 mL  30 mL Oral Daily PRN Jimmy Footman, MD      . nicotine polacrilex (NICORETTE) gum 2 mg  2 mg Oral PRN Audery Amel, MD      . prazosin (MINIPRESS) capsule 2 mg  2 mg Oral BID Shari Prows, MD   2 mg at 06/05/15 0949  . traZODone (DESYREL) tablet 100 mg  100 mg Oral QHS PRN Jimmy Footman, MD   100 mg at 06/04/15 2103    Lab Results:  No results found for this or any previous visit (from the past 48 hour(s)).  Physical Findings: AIMS:  , ,  ,  ,    CIWA:    COWS:     Treatment Plan Summary: Daily contact with patient to assess and evaluate symptoms and progress in treatment and Medication management   Medical Decision Making:  Established Problem, Stable/Improving (1), Review of Psycho-Social Stressors (1), Review or order clinical lab tests (1), Review of Medication Regimen &  Side Effects (2) and Review of New Medication or Change in Dosage (2)   [name] is a 49 year old female with a history of depression, anxiety, and ADHD admitted for worsening of depression and paranoia.  1. Mood and psychosis. We will start Haldol at night for psychosis and Luvox for depression and  anxiety of OCD type.  2. Anxiety. The patient reports panic attacks, OCD and PTSD type symptoms. We started Luvox and will increase dose to 100 mg tonight. We will offer Minipress for nightmares and flashbacks. We'll offer low dose Ativan while in the hospital.  3. Hypothyroidism. We'll continue Synthroid  4. Smoking. Nicotine products are available.  5. Disposition. She will be discharged to home with family. She will follow-up with [facility]. Chart reviewed. Patient  interviewed. Continue current medication and supportive counseling. Changed treatment for nicotine withdrawal.     Mordecai Rasmussen 06/05/2015, 12:36 PM

## 2015-06-05 NOTE — Progress Notes (Signed)
D) Patient visible in the milieu. Her mood is reported as "ok" affect remains sad and eye contact brief. At the time of the nursing assessment she denied suicidal ideations, denies thoughts to harm others or self. She denies auditory or visual hallucinations. Appetite remains good, ADL's poor as she appears disheveled. She is medication compliant. She reported nightmares and feelings of uneasiness throughout the night and  associated poor sleep. She stated that she did not believe it was anxiety related. During medication administration she showed a circular rash on bilateral upper arm where her nicotine patch had been placed. It was removed and her provider was notified. She remains on Q15 min observation for safety.  A) Staff continues to provide safe therapeutic environment. She is provided with medication as ordered . She  is given education on prescribed medication and importance participating in therapeutic groups.   R) Patient is receptive to all education and needs additional reinforcement. She has maintained safe therapeutic boundaries with others. She is encouraged to approach staff if feeling unsafe and she verbalized understanding of all provided education.  Marland Kitchen

## 2015-06-05 NOTE — Plan of Care (Signed)
Problem: Alteration in thought process Goal: LTG-Patient has not harmed self or others in at least 2 days Outcome: Progressing Pt has not harmed self since admission

## 2015-06-05 NOTE — [facility] Group Notes (Signed)
[facility] LCSW Group Therapy  06/05/2015 4:18 PM  Type of Therapy:  Group Therapy  Participation Level:  Minimal  Participation Quality:  Attentive  Affect:  Depressed  Cognitive:  Alert  Insight:  Limited  Engagement in Therapy:  Limited  Modes of Intervention:  Discussion, Education, Socialization and Support  Summary of Progress/Problems:Pt will identify unhealthy thoughts and how they impact their emotions and behavior. Pt will be encouraged to discuss these thoughts, emotions and behaviors with the group. She attended group and stayed the entire time. She sat quietly and listened to other group members.  [employer] MSW, LCSWA  06/05/2015, 4:18 PM

## 2015-06-06 NOTE — Progress Notes (Signed)
Patient has been out of her room most of the day. Mood is improving. Smiling at times. Still having some delusional thoughts but coping with them. Denies complaints. Denies SI/HI/AVH.

## 2015-06-06 NOTE — Progress Notes (Signed)
D: Pt is awake and active in the milieu this evening. Pt mood is depressed and her affect is sad. Pt denies SI/HI and AVH, but her speech is soft and slow. Pt has minimal interaction and seems preoccupied, but is pleasant and cooperative with staff.  A: Writer provided emotional support and administered medications as prescribed.   R: Pt behavior is appropriate on the unit and she is taking medications as prescribed. Pt went to bed following evening snack.

## 2015-06-06 NOTE — [facility] Group Notes (Signed)
[facility] Group Notes:  (Nursing/MHT/Case Management/Adjunct)  Date:  06/06/2015  Time:  12:11 AM  Type of Therapy:  Group Therapy/Outside Activity  Participation Level:  Minimal  Participation Quality:  Appropriate  Affect:  Appropriate  Cognitive:  Alert  Insight:  None  Engagement in Group:  Minimal  Modes of Intervention:  Activity  Summary of Progress/Problems:  Amber Acevedo 06/06/2015, 12:11 AM

## 2015-06-06 NOTE — [facility] Suicide Risk Assessment (Signed)
[facility] INPATIENT:  Family/Significant Other Suicide Prevention Education  Suicide Prevention Education:  Education Completed; Destinie Thornsberry (husband) [phone] has been identified by the patient as the family member/significant other with whom the patient will be residing, and identified as the person(s) who will aid the patient in the event of a mental health crisis (suicidal ideations/suicide attempt).  With written consent from the patient, the family member/significant other has been provided the following suicide prevention education, prior to the and/or following the discharge of the patient.  The suicide prevention education provided includes the following:  Suicide risk factors  Suicide prevention and interventions  National Suicide Hotline telephone number  [facility] assessment telephone number  [facility] Emergency Assistance 911  [facility] and/or Residential Mobile Crisis Unit telephone number  Request made of family/significant other to:  Remove weapons (e.g., guns, rifles, knives), all items previously/currently identified as safety concern.    Remove drugs/medications (over-the-counter, prescriptions, illicit drugs), all items previously/currently identified as a safety concern.  The family member/significant other verbalizes understanding of the suicide prevention education information provided.  The family member/significant other agrees to remove the items of safety concern listed above.  Kamesha Herne L Wylie Coon MSW, LCSWA  06/06/2015, 10:19 AM

## 2015-06-06 NOTE — [facility] Group Notes (Signed)
[facility] LCSW Group Therapy  06/06/2015 2:27 PM  Type of Therapy: Group Therapy  Participation Level: Minimal  Participation Quality: Attentive  Affect: Depressed   Cognitive: Alert  Insight: Limited  Engagement in Therapy: Improving  Modes of Intervention: Discussion, Education, Socialization and Support  Summary of Progress/Problems: Todays topic: Grudges Patients will be encouraged to discuss their thoughts, feelings, and behaviors as to why one holds on to grudges and reasons why people have grudges. Patients will process the impact of grudges on their daily lives and identify thoughts and feelings related to holding grudges. Patients will identify feelings and thoughts related to what life would look like without grudges. Amber Acevedo attended group and stayed the entire time. She sat quietly and listened to other group members. She states she is feeling better today than yesterday.   [employer] MSW, LCSWA  06/06/2015, 2:27 PM

## 2015-06-06 NOTE — Progress Notes (Signed)
[facility] MD Progress Note  06/06/2015 2:46 PM KENSIE SUSMAN  MRN:  [id number]  Subjective: Affect is more calm and less anxious. Less focused on paranoid obsessions. Getting out of her room and interacting with others. Affect is still a little bit blunted.  Principal Problem: Major depressive disorder, recurrent episode, severe with mood-congruent psychotic features Diagnosis:   Patient Active Problem List   Diagnosis Date Noted  . Major depressive disorder, recurrent episode, severe with mood-congruent psychotic features [F33.3] 06/03/2015  . HTN (hypertension) [I10] 06/02/2015  . Hypothyroidism [E03.9] 06/02/2015  . Tobacco use disorder [Z72.0] 06/02/2015   Total Time spent with patient: 20 minutes   Past Medical History:  Past Medical History  Diagnosis Date  . Diabetes mellitus without complication   . Hypertension   . Thyroid disease   . Arthritis     Past Surgical History  Procedure Laterality Date  . Appendectomy    . Knee surgery    . Cesarean section     Family History: History reviewed. No pertinent family history. Social History:  History  Alcohol Use No     History  Drug Use No    Social History   Social History  . Marital Status: Married    Spouse Name: N/A  . Number of Children: N/A  . Years of Education: N/A   Social History Main Topics  . Smoking status: Current Every Day Smoker -- 1.00 packs/day  . Smokeless tobacco: None  . Alcohol Use: No  . Drug Use: No  . Sexual Activity: Not Asked   Other Topics Concern  . None   Social History Narrative   Additional History:    Sleep: Fair  Appetite:  Fair   Assessment:   Musculoskeletal: Strength & Muscle Tone: within normal limits Gait & Station: normal Patient leans: N/A   Psychiatric Specialty Exam: Physical Exam  Nursing note and vitals reviewed. Constitutional: She appears well-developed and well-nourished.  HENT:  Head: Normocephalic and atraumatic.  Eyes: Conjunctivae are  normal. Pupils are equal, round, and reactive to light.  Neck: Normal range of motion.  Cardiovascular: Normal heart sounds.   Respiratory: Effort normal.  GI: Soft.  Musculoskeletal: Normal range of motion.  Neurological: She is alert.  Skin: Skin is warm and dry.  Psychiatric: Her speech is normal and behavior is normal. Her mood appears anxious. Thought content is paranoid. Cognition and memory are normal. She expresses impulsivity. She exhibits a depressed mood.  Patient appears calmer then she was described as being earlier. Good eye contact. Still seems a little paranoid and confused.    Review of Systems  Constitutional: Negative.   HENT: Negative.   Eyes: Negative.   Respiratory: Negative.   Cardiovascular: Negative.   Gastrointestinal: Negative.   Musculoskeletal: Negative.   Skin: Positive for rash.  Neurological: Negative.   All other systems reviewed and are negative.   Blood pressure 109/74, pulse 80, temperature 98.2 F (36.8 C), temperature source Oral, resp. rate 20, height 5' (1.524 m), weight 76.658 kg (169 lb), SpO2 94 %.Body mass index is 33.01 kg/(m^2).  General Appearance: Casual  Eye Contact::  Fair  Speech:  Normal Rate  Volume:  Normal  Mood:  Anxious and Depressed  Affect:  Tearful  Thought Process:  Goal Directed  Orientation:  Full (Time, Place, and Person)  Thought Content:  Delusions, Obsessions and Paranoid Ideation  Suicidal Thoughts:  No  Homicidal Thoughts:  No  Memory:  Immediate;   Fair Recent;   Fair  Remote;   Fair  Judgement:  Fair  Insight:  Fair  Psychomotor Activity:  Normal  Concentration:  Fair  Recall:  [employer] of Knowledge:Fair  Language: Fair  Akathisia:  No  Handed:  Right  AIMS (if indicated):     Assets:  Communication Skills Desire for Improvement  ADL's:  Intact  Cognition: WNL  Sleep:  Number of Hours: 6.75     Current Medications: Current Facility-Administered Medications  Medication Dose Route  Frequency Provider Last Rate Last Dose  . acetaminophen (TYLENOL) tablet 650 mg  650 mg Oral Q6H PRN Jimmy Footman, MD      . alum & mag hydroxide-simeth (MAALOX/MYLANTA) 200-200-20 MG/5ML suspension 30 mL  30 mL Oral Q4H PRN Jimmy Footman, MD      . fluvoxaMINE (LUVOX) tablet 100 mg  100 mg Oral QHS Jolanta B Pucilowska, MD   100 mg at 06/05/15 2111  . haloperidol (HALDOL) tablet 2 mg  2 mg Oral QHS Jolanta B Pucilowska, MD   2 mg at 06/05/15 2111  . ibuprofen (ADVIL,MOTRIN) tablet 600 mg  600 mg Oral Q6H PRN Jimmy Footman, MD      . levothyroxine (SYNTHROID, LEVOTHROID) tablet 250 mcg  250 mcg Oral QAC breakfast Jimmy Footman, MD   250 mcg at 06/06/15 0750  . LORazepam (ATIVAN) tablet 0.5 mg  0.5 mg Oral Q6H PRN Shari Prows, MD   0.5 mg at 06/03/15 1806  . magnesium hydroxide (MILK OF MAGNESIA) suspension 30 mL  30 mL Oral Daily PRN Jimmy Footman, MD      . nicotine polacrilex (NICORETTE) gum 2 mg  2 mg Oral PRN Audery Amel, MD      . prazosin (MINIPRESS) capsule 2 mg  2 mg Oral BID Shari Prows, MD   2 mg at 06/06/15 0750  . traZODone (DESYREL) tablet 100 mg  100 mg Oral QHS PRN Jimmy Footman, MD   100 mg at 06/05/15 2111    Lab Results:  No results found for this or any previous visit (from the past 48 hour(s)).  Physical Findings: AIMS:  , ,  ,  ,    CIWA:    COWS:     Treatment Plan Summary: Daily contact with patient to assess and evaluate symptoms and progress in treatment and Medication management   Medical Decision Making:  Established Problem, Stable/Improving (1), Review of Psycho-Social Stressors (1), Review or order clinical lab tests (1), Review of Medication Regimen & Side Effects (2) and Review of New Medication or Change in Dosage (2)   Ms. Szumski is a 49 year old female with a history of depression, anxiety, and ADHD admitted for worsening of depression and paranoia.  1. Mood  and psychosis. We will start Haldol at night for psychosis and Luvox for depression and  anxiety of OCD type.  2. Anxiety. The patient reports panic attacks, OCD and PTSD type symptoms. We started Luvox and will increase dose to 100 mg tonight. We will offer Minipress for nightmares and flashbacks. We'll offer low dose Ativan while in the hospital.  3. Hypothyroidism. We'll continue Synthroid  4. Smoking. Nicotine products are available.  5. Disposition. She will be discharged to home with family. She will follow-up with [facility]. Chart reviewed. Patient interviewed. Continue current medication and supportive counseling. Changed treatment for nicotine withdrawal. No change to psychiatric medicine for today. Supportive counseling completed.     John Clapacs 06/06/2015, 2:46 PM

## 2015-06-07 MED ORDER — PRAZOSIN HCL 2 MG PO CAPS: 2.0000 mg | ORAL_CAPSULE | Freq: Two times a day (BID) | ORAL | Status: DC

## 2015-06-07 MED ORDER — FLUVOXAMINE MALEATE 100 MG PO TABS: 100.0000 mg | ORAL_TABLET | Freq: Every day | ORAL | Status: DC

## 2015-06-07 MED ORDER — TRAZODONE HCL 100 MG PO TABS: 100.0000 mg | ORAL_TABLET | Freq: Every evening | ORAL | Status: DC | PRN

## 2015-06-07 MED ORDER — HALOPERIDOL 2 MG PO TABS: 2.0000 mg | ORAL_TABLET | Freq: Every day | ORAL | Status: DC

## 2015-06-07 NOTE — Progress Notes (Signed)
Recreation Therapy Notes  Date: 08.22.16 Time: 3:00 pm Location: Craft Room  Group Topic: Self-expression  Goal Area(s) Addresses:  Patient will identify one color per emotion listed on the wheel. Patient will verbalize benefit of using art as a means of self-expression. Patient will verbalize one emotion experienced during session. Patient will be educated on other forms of self-expression.  Behavioral Response: Attentive, Interactive  Intervention:  Emotion Wheel  Activity: Patients were given an Arboriculturist with seven emotions. Patients were instructed to pick a color for each emotion and color it in.  Education: LRT educated patients on different forms of self-expression.  Education Outcome: Acknowledges education/In group clarification offered   Clinical Observations/Feedback: Patient completed worksheet. Patient contributed to group discussion by stating what colors she picked for certain emotions and how it felt to describe her emotions in color.  Jacquelynn Cree, LRT/CTRS 06/07/2015 4:38 PM

## 2015-06-07 NOTE — Progress Notes (Signed)
Recreation Therapy Notes  INPATIENT RECREATION TR PLAN  Patient Details Name: Amber Acevedo MRN: [id number] DOB: 1966-03-27 Today's Date: 06/07/2015  Rec Therapy Plan Is patient appropriate for Therapeutic Recreation?: Yes Treatment times per week: At least once a week TR Treatment/Interventions: 1:1 session, Group participation (Comment) (Appropriate participation in daily recreation therapy tx)  Discharge Criteria Pt will be discharged from therapy if:: Discharged Treatment plan/goals/alternatives discussed and agreed upon by:: Patient/family  Discharge Summary Short term goals set: See Care Plan Short term goals met: Complete Progress toward goals comments: One-to-one attended Which groups?: Communication, Other (Comment), Leisure education (Problem solving, teamwork) One-to-one attended: Self-esteem, stress management, communication Reason goals not met: N/A Therapeutic equipment acquired: None Reason patient discharged from therapy: Discharge from hospital Pt/family agrees with progress & goals achieved: Yes Date patient discharged from therapy: 06/07/15   Leonette [facility], LRT/CTRS 06/07/2015, 1:40 PM

## 2015-06-07 NOTE — Plan of Care (Signed)
Problem: [facility] Participation in Recreation Therapeutic Interventions Goal: STG-Patient will demonstrate improved self esteem by identif STG: Self-Esteem - Within 4 treatment sessions, patient will verbalize at least 5 positive affirmation statements in each of 2 treatment sessions to increase self-esteem post d/c.  Outcome: Completed/Met Date Met:  06/07/15 Treatment Session 2; Completed 2 out of 2: At approximately 1:00 pm, LRT met with patient in patient room. Patient verbalized 6 positive affirmation statements. Patient reported it felt "a little bit better than before". LRT encouraged patient to continue saying positive affirmation statements.  Leonette [facility]arch, LRT/CTRS 08.22.16 1:37 pm Goal: STG-Other Recreation Therapy Goal (Specify) STG: Stress Management - Within 5 treatment sessions, patient will demonstrate at least one stress management technique in one treatment session to increase stress management techniques post d/c.  Outcome: Completed/Met Date Met:  06/07/15 Treatment Session 2; Completed 1 out of 1: At approximately 1:00 pm, LRT met with patient in patient room. Patient reported she read over and practiced the stress management techniques. Patient demonstrated one stress management technique. LRT encouraged patient to continue practicing the stress management techniques.  Leonette [facility]arch, LRT/CTRS 08.22.16 1:38 pm

## 2015-06-07 NOTE — [facility] Suicide Risk Assessment (Signed)
[facility] Discharge Suicide Risk Assessment   Demographic Factors:  Caucasian  Total Time spent with patient: 30 minutes  Musculoskeletal: Strength & Muscle Tone: within normal limits Gait & Station: normal Patient leans: N/A  Psychiatric Specialty Exam: Physical Exam  Nursing note and vitals reviewed.   Review of Systems  All other systems reviewed and are negative.   Blood pressure 123/80, pulse 88, temperature 98.6 F (37 C), temperature source Oral, resp. rate 20, height 5' (1.524 m), weight 76.658 kg (169 lb), SpO2 94 %.Body mass index is 33.01 kg/(m^2).  General Appearance: Casual  Eye Contact::  Good  Speech:  Clear and Coherent409  Volume:  Normal  Mood:  Euthymic  Affect:  Appropriate  Thought Process:  Goal Directed  Orientation:  Full (Time, Place, and Person)  Thought Content:  WDL  Suicidal Thoughts:  No  Homicidal Thoughts:  No  Memory:  Immediate;   Fair Recent;   Fair Remote;   Fair  Judgement:  Fair  Insight:  Fair  Psychomotor Activity:  Normal  Concentration:  Fair  Recall:  [employer] of Knowledge:Fair  Language: Fair  Akathisia:  No  Handed:  Right  AIMS (if indicated):     Assets:  Communication Skills Desire for Improvement Financial Resources/Insurance Housing Intimacy Physical Health Resilience Social Support  Sleep:  Number of Hours: 5  Cognition: WNL  ADL's:  Intact   Have you used any form of tobacco in the last 30 days? (Cigarettes, Smokeless Tobacco, Cigars, and/or Pipes): Yes  Has this patient used any form of tobacco in the last 30 days? (Cigarettes, Smokeless Tobacco, Cigars, and/or Pipes) Yes, A prescription for an FDA-approved tobacco cessation medication was offered at discharge and the patient refused  Mental Status Per Nursing Assessment::   On Admission:     Current Mental Status by Physician: NA  Loss Factors: NA  Historical Factors: NA  Risk Reduction Factors:   Sense of responsibility to family, Employed,  Living with another person, especially a relative and Positive social support  Continued Clinical Symptoms:  Severe Anxiety and/or Agitation Bipolar Disorder:   Depressive phase  Cognitive Features That Contribute To Risk:  None    Suicide Risk:  Minimal: No identifiable suicidal ideation.  Patients presenting with no risk factors but with morbid ruminations; may be classified as minimal risk based on the severity of the depressive symptoms  Principal Problem: Major depressive disorder, recurrent episode, severe with mood-congruent psychotic features Discharge Diagnoses:  Patient Active Problem List   Diagnosis Date Noted  . Major depressive disorder, recurrent episode, severe with mood-congruent psychotic features [F33.3] 06/03/2015  . HTN (hypertension) [I10] 06/02/2015  . Hypothyroidism [E03.9] 06/02/2015  . Tobacco use disorder [Z72.0] 06/02/2015      Plan Of Care/Follow-up recommendations:  Activity:  as tolerated. Diet:  low sodium heart healthy. Other:  P follow-up appointments.  Is patient on multiple antipsychotic therapies at discharge:  No   Has Patient had three or more failed trials of antipsychotic monotherapy by history:  No  Recommended Plan for Multiple Antipsychotic Therapies: NA    Amber Acevedo 06/07/2015, 10:36 AM

## 2015-06-07 NOTE — Progress Notes (Signed)
Registered Nurse Signed Nursing Progress Notes 06/07/2015 4:45 PM    Expand All Collapse All   Discharge Note. Pt has been awake and alert x4. She has a bright affect and reports that she is "feeling much better and ready to go home". Pt's belongings  were returned to her.Patient denies suicidal or homicidal thoughts. Also denies AH or VH. Pt given follow up instructions along with crisis card, and prescriptions.  A 7 day supply of medication was also given her and a work note. Her husband is here to pick her up.She was escorted off the unit by staff. No distress noted.

## 2015-06-07 NOTE — Discharge Summary (Signed)
Physician Discharge Summary Note  Patient:  Amber Acevedo is an 49 y.o., female MRN:  [id number] DOB:  May 07, 1966 Patient phone:  [phone] (home)  Patient address:   [address] [place] [address],  Total Time spent with patient: 30 minutes  Date of Admission:  06/02/2015 Date of Discharge: 06/07/2015  Reason for Admission:  Psychotic break.  Identifying data. Amber Acevedo is an 49 year old female with history of depression and anxiety.  Chief complaint. "I am paranoid."  History of present illness. Amber Acevedo has a history of depression. She was treated with Zoloft and Concerta with improvement for several years. She stopped taking medication a year and a half ago. She gradually became increasingly depressed and anxious. She felt that she was able to control her symptoms but in the past several months he became psychotic. She is paranoid feeling that people are out to get her. She believes that her family is going to harm her for all the awful things that she has done to them. She believes that she could have killed some of her family members. She believes that everybody was her by her now conspired to put her in the hospital forever. She hears her name called. She sees shadows. She reports extremely poor sleep, decreased appetite with weight loss, poor energy and concentration, social isolation, feeling of guilt and hopelessness worthlessness, anhedonia, crying spells and psychotic symptoms. She reports heightened anxiety with panic attacks, nightmares and flashbacks of PTSD type, and OCD with excessive worries cleaning, organizing, rechecking information. She has been drawing attention to her unusual behavior from family and from coworkers. She denies symptoms suggestive of bipolar mania. She denies alcohol or illicit substance use.  Past psychiatric history. She has never been hospitalized. No history of substance use. No suicide attempts. She was treated in the past with Zoloft for  depression and Concerta for ADHD. She believes that the medications were helpful. She stopped taking them one half years ago. She did see a psychiatrist at [facility] more recently but was only prescribed trazodone for sleep.  Family psychiatric history. Father with unknown mental illness and sister with bipolar.  Social history. She is married and lives with her husband. She is employed. There is no health insurance.  Principal Problem: Major depressive disorder, recurrent episode, severe with mood-congruent psychotic features Discharge Diagnoses: Patient Active Problem List   Diagnosis Date Noted  . Major depressive disorder, recurrent episode, severe with mood-congruent psychotic features [F33.3] 06/03/2015  . HTN (hypertension) [I10] 06/02/2015  . Hypothyroidism [E03.9] 06/02/2015  . Tobacco use disorder [Z72.0] 06/02/2015    Musculoskeletal: Strength & Muscle Tone: within normal limits Gait & Station: normal Patient leans: N/A  Psychiatric Specialty Exam: Physical Exam  Nursing note and vitals reviewed.   Review of Systems  All other systems reviewed and are negative.   Blood pressure 123/80, pulse 88, temperature 98.6 F (37 C), temperature source Oral, resp. rate 20, height 5' (1.524 m), weight 76.658 kg (169 lb), SpO2 94 %.Body mass index is 33.01 kg/(m^2).  See SRA.                                                  Sleep:  Number of Hours: 5   Have you used any form of tobacco in the last 30 days? (Cigarettes, Smokeless Tobacco, Cigars, and/or Pipes): Yes  Has  this patient used any form of tobacco in the last 30 days? (Cigarettes, Smokeless Tobacco, Cigars, and/or Pipes) Yes, A prescription for an FDA-approved tobacco cessation medication was offered at discharge and the patient refused  Past Medical History:  Past Medical History  Diagnosis Date  . Diabetes mellitus without complication   . Hypertension   . Thyroid disease   . Arthritis      Past Surgical History  Procedure Laterality Date  . Appendectomy    . Knee surgery    . Cesarean section     Family History: History reviewed. No pertinent family history. Social History:  History  Alcohol Use No     History  Drug Use No    Social History   Social History  . Marital Status: Married    Spouse Name: N/A  . Number of Children: N/A  . Years of Education: N/A   Social History Main Topics  . Smoking status: Current Every Day Smoker -- 1.00 packs/day  . Smokeless tobacco: None  . Alcohol Use: No  . Drug Use: No  . Sexual Activity: Not Asked   Other Topics Concern  . None   Social History Narrative    Past Psychiatric History: Hospitalizations:  Outpatient Care:  Substance Abuse Care:  Self-Mutilation:  Suicidal Attempts:  Violent Behaviors:   Risk to Self: Suicidal Ideation: No Suicidal Intent: No Is patient at risk for suicide?: No Suicidal Plan?: No Access to Means: No What has been your use of drugs/alcohol within the last 12 months?: Denies use  Other Self Harm Risks: None Reported Triggers for Past Attempts: None known Intentional Self Injurious Behavior: None Risk to Others: Homicidal Ideation: No Thoughts of Harm to Others: No Current Homicidal Intent: No Current Homicidal Plan: No Access to Homicidal Means: No Identified Victim: None Reported History of harm to others?: No Assessment of Violence: None Noted Violent Behavior Description: None Reported Does patient have access to weapons?: No Criminal Charges Pending?: No Does patient have a court date: No Prior Inpatient Therapy: Prior Inpatient Therapy: No Prior Therapy Dates: n/a Prior Therapy Facilty/Provider(s): n/a Reason for Treatment: n/a Prior Outpatient Therapy: Prior Outpatient Therapy: Yes Prior Therapy Dates: Currently Prior Therapy Facilty/Provider(s): [facility] Reason for Treatment: Psychosis (Medication Management, Group  Therapy) Does patient have an ACCT team?: No Does patient have Intensive In-House Services?  : No Does patient have [facility] services? : No Does patient have [facility] services?: No  Level of Care:  OP  Hospital Course:    Amber Acevedo is a 49 year old female with a history of depression, anxiety, and ADHD admitted for worsening of depression and paranoia.  1. Mood and psychosis. We started Haldol at night for psychosis and Luvox for depression and anxiety of OCD type.   2. Anxiety. The patient reports panic attacks, OCD and PTSD type symptoms. We started Luvox. We also offered Minipress for nightmares and flashbacks.   3. Hypothyroidism. We continued Synthroid  4. Smoking. Nicotine products were available.  5. Insomnia. She responded well to trazodone.  6. Disposition. She was discharged to home with family. She will follow-up with [facility].   Consults:  None  Significant Diagnostic Studies:  None  Discharge Vitals:   Blood pressure 123/80, pulse 88, temperature 98.6 F (37 C), temperature source Oral, resp. rate 20, height 5' (1.524 m), weight 76.658 kg (169 lb), SpO2 94 %. Body mass index is 33.01 kg/(m^2). Lab Results:   No results found for this or any previous visit (  from the past 72 hour(s)).  Physical Findings: AIMS:  , ,  ,  ,    CIWA:    COWS:      See Psychiatric Specialty Exam and Suicide Risk Assessment completed by Attending Physician prior to discharge.  Discharge destination:  Home  Is patient on multiple antipsychotic therapies at discharge:  No   Has Patient had three or more failed trials of antipsychotic monotherapy by history:  No    Recommended Plan for Multiple Antipsychotic Therapies: NA  Discharge Instructions    Diet - low sodium heart healthy    Complete by:  As directed      Increase activity slowly    Complete by:  As directed             Medication List    STOP taking these medications        oxyCODONE-acetaminophen 5-325 MG per  tablet  Commonly known as:  PERCOCET/ROXICET     sertraline 100 MG tablet  Commonly known as:  ZOLOFT      TAKE these medications      Indication   fluvoxaMINE 100 MG tablet  Commonly known as:  LUVOX  Take 1 tablet (100 mg total) by mouth at bedtime.   Indication:  Obsessive Compulsive Disorder     haloperidol 2 MG tablet  Commonly known as:  HALDOL  Take 1 tablet (2 mg total) by mouth at bedtime.   Indication:  Psychosis     levothyroxine 100 MCG tablet  Commonly known as:  SYNTHROID, LEVOTHROID  Take 250 mcg by mouth daily before breakfast.      prazosin 2 MG capsule  Commonly known as:  MINIPRESS  Take 1 capsule (2 mg total) by mouth 2 (two) times daily.   Indication:  PTSD     traZODone 100 MG tablet  Commonly known as:  DESYREL  Take 1 tablet (100 mg total) by mouth at bedtime as needed for sleep.   Indication:  Trouble Sleeping         Follow-up recommendations:  Activity:  as tolerated. Diet:  low sodium heart healthy. Other:  keep follow-up appointments.  Comments:    Total Discharge Time: 35 min.  Signed: Kristine Linea 06/07/2015, 10:55 AM

## 2015-06-08 NOTE — Progress Notes (Signed)
[facility] H&P Discharge Summary faxed to [facility] for hospital follow-up °

## 2016-09-10 ENCOUNTER — Encounter (HOSPITAL_COMMUNITY): Payer: Self-pay | Admitting: Emergency Medicine

## 2016-09-10 ENCOUNTER — Emergency Department (HOSPITAL_COMMUNITY)
Admission: EM | Admit: 2016-09-10 | Discharge: 2016-09-10 | Disposition: A | Discharge status: Home / Self Care | Payer: Self-pay | Attending: Emergency Medicine | Admitting: Emergency Medicine

## 2016-09-10 ENCOUNTER — Emergency Department (HOSPITAL_COMMUNITY): Payer: Self-pay

## 2016-09-10 DIAGNOSIS — X500XXA Overexertion from strenuous movement or load, initial encounter: Secondary | ICD-10-CM | POA: Insufficient documentation

## 2016-09-10 DIAGNOSIS — F17210 Nicotine dependence, cigarettes, uncomplicated: Secondary | ICD-10-CM | POA: Insufficient documentation

## 2016-09-10 DIAGNOSIS — Z7982 Long term (current) use of aspirin: Secondary | ICD-10-CM | POA: Insufficient documentation

## 2016-09-10 DIAGNOSIS — M25551 Pain in right hip: Secondary | ICD-10-CM | POA: Insufficient documentation

## 2016-09-10 DIAGNOSIS — E039 Hypothyroidism, unspecified: Secondary | ICD-10-CM | POA: Insufficient documentation

## 2016-09-10 DIAGNOSIS — Y929 Unspecified place or not applicable: Secondary | ICD-10-CM | POA: Insufficient documentation

## 2016-09-10 DIAGNOSIS — E119 Type 2 diabetes mellitus without complications: Secondary | ICD-10-CM | POA: Insufficient documentation

## 2016-09-10 DIAGNOSIS — Z79899 Other long term (current) drug therapy: Secondary | ICD-10-CM | POA: Insufficient documentation

## 2016-09-10 DIAGNOSIS — I10 Essential (primary) hypertension: Secondary | ICD-10-CM | POA: Insufficient documentation

## 2016-09-10 DIAGNOSIS — Y990 Civilian activity done for income or pay: Secondary | ICD-10-CM | POA: Insufficient documentation

## 2016-09-10 DIAGNOSIS — Y93F2 Activity, caregiving, lifting: Secondary | ICD-10-CM | POA: Insufficient documentation

## 2016-09-10 MED ORDER — NAPROXEN 500 MG PO TABS: 500.0000 mg | ORAL_TABLET | Freq: Two times a day (BID) | ORAL | 0 refills | Status: DC

## 2016-09-10 MED ORDER — IBUPROFEN 800 MG PO TABS
800.0000 mg | ORAL_TABLET | Freq: Once | ORAL | Status: AC
  Administered 2016-09-10: 800 mg via ORAL
  Filled 2016-09-10: qty 1

## 2016-09-10 MED ORDER — HYDROCODONE-ACETAMINOPHEN 7.5-325 MG PO TABS: 1.0000 | ORAL_TABLET | Freq: Four times a day (QID) | ORAL | 0 refills | Status: DC | PRN

## 2016-09-10 MED ORDER — CYCLOBENZAPRINE HCL 10 MG PO TABS
10.0000 mg | ORAL_TABLET | Freq: Once | ORAL | Status: AC
  Administered 2016-09-10: 10 mg via ORAL
  Filled 2016-09-10: qty 1

## 2016-09-10 MED ORDER — METHOCARBAMOL 500 MG PO TABS: 500.0000 mg | ORAL_TABLET | Freq: Three times a day (TID) | ORAL | 0 refills | Status: DC

## 2016-09-10 MED ORDER — HYDROCODONE-ACETAMINOPHEN 5-325 MG PO TABS
1.0000 | ORAL_TABLET | Freq: Once | ORAL | Status: AC
  Administered 2016-09-10: 1 via ORAL
  Filled 2016-09-10: qty 1

## 2016-09-10 NOTE — ED Provider Notes (Signed)
[facility]Y DEPT Provider Note   CSN: [id number] Arrival date & time: 09/10/16  1134   By signing my name below, I, Amber Acevedo, attest that this documentation has been prepared under the direction and in the presence of [name], PA-C Electronically Signed: Valentino Acevedo, ED Scribe. 09/10/16. 3:05 PM.  History   Chief Complaint Chief Complaint  Patient presents with  . Hip Pain   The history is provided by the patient. No language interpreter was used.   HPI Comments: Amber Acevedo is a 50 y.o. female who presents to the Emergency Department complaining of moderate, constant, right hip pain onset ~a week ago. Pt denies any injury to the affected area.  Pt states she lifts heavy objects at work that may have brought on her right hip pain. She notes when lifting heavy objects, it causes her hip to pop. Pt describes the pain as an electrical shock, "feels like it the pain is right in the bone". She reports her right hip pain is radiated down her right leg. Pt notes her pain is exacerbated with movements such as bending, sitting and walking. Pt also notes pain is worsened with bearing weight on her right hip.  Pt reports taking over the counter medications with no relief. She denies abdominal pain. Urine or bowel changes, fever, numbness or weakness of the LE's.   Past Medical History:  Diagnosis Date  . Arthritis   . Diabetes mellitus without complication (HCC)   . Hypertension   . Thyroid disease     Patient Active Problem List   Diagnosis Date Noted  . Major depressive disorder, recurrent episode, severe with mood-congruent psychotic features (HCC) 06/03/2015  . HTN (hypertension) 06/02/2015  . Hypothyroidism 06/02/2015  . Tobacco use disorder 06/02/2015    Past Surgical History:  Procedure Laterality Date  . APPENDECTOMY    . CESAREAN SECTION    . KNEE SURGERY      OB History    Gravida Para Term Preterm AB Living   2 2 2     2    SAB TAB Ectopic  Multiple Live Births                   Home Medications    Prior to Admission medications   Medication Sig Start Date End Date Taking? Authorizing Provider  Aspirin-Acetaminophen (GOODY BODY PAIN) 500-325 MG PACK Take 1 packet by mouth daily as needed.   Yes Historical Provider, MD  benztropine (COGENTIN) 1 MG tablet 1 tablet at bedtime. 09/05/16  Yes Historical Provider, MD  citalopram (CELEXA) 20 MG tablet 1 tablet daily. 08/11/16  Yes Historical Provider, MD  fluvoxaMINE (LUVOX) 100 MG tablet Take 1 tablet (100 mg total) by mouth at bedtime. 06/07/15  Yes Jolanta B Pucilowska, MD  haloperidol (HALDOL) 2 MG tablet Take 1 tablet (2 mg total) by mouth at bedtime. 06/07/15  Yes Jolanta B Pucilowska, MD  levothyroxine (SYNTHROID, LEVOTHROID) 200 MCG tablet Take 200 mcg by mouth daily before breakfast.    Yes Historical Provider, MD  prazosin (MINIPRESS) 2 MG capsule Take 1 capsule (2 mg total) by mouth 2 (two) times daily. Patient taking differently: Take 2 mg by mouth at bedtime.  06/07/15  Yes Shari Prows, MD    Family History History reviewed. No pertinent family history.  Social History Social History  Substance Use Topics  . Smoking status: Current Every Day Smoker    Packs/day: 1.00    Types: Cigarettes  . Smokeless tobacco: Never  Used  . Alcohol use No     Allergies   Amoxicillin   Review of Systems Review of Systems  Constitutional: Negative for fever.  Respiratory: Negative for shortness of breath.   Gastrointestinal: Negative for abdominal pain, constipation and vomiting.  Genitourinary: Negative for decreased urine volume, difficulty urinating, dysuria, flank pain and hematuria.  Musculoskeletal: Positive for arthralgias (right hip) and back pain (lower right ). Negative for joint swelling.  Skin: Negative for rash.  Neurological: Negative for weakness and numbness.  All other systems reviewed and are negative.    Physical Exam Updated Vital  Signs BP 118/72 (BP Location: Left Arm)   Pulse 80   Temp 97.6 F (36.4 C) (Temporal)   Resp 18   Ht 5' (1.524 m)   Wt 201 lb (91.2 kg)   SpO2 100%   BMI 39.26 kg/m   Physical Exam  Constitutional: She appears well-developed and well-nourished.  HENT:  Head: Normocephalic and atraumatic.  Eyes: Conjunctivae are normal. Right eye exhibits no discharge. Left eye exhibits no discharge.  Cardiovascular: Normal rate, regular rhythm and intact distal pulses.   Pulmonary/Chest: Effort normal. No respiratory distress.  Abdominal: Soft. She exhibits no distension. There is no tenderness.  Musculoskeletal: Normal range of motion. She exhibits tenderness.  Tender to anterior and lateral right hip. Pain is reproduced with external rotation and straight leg raise. Mild tenderness of the right SI joint. No spinal tenderness.  Sensation intact.    Neurological: She is alert. Coordination normal.  Skin: Skin is warm and dry. No rash noted. She is not diaphoretic. No erythema.  Psychiatric: She has a normal mood and affect.  Nursing note and vitals reviewed.    ED Treatments / Results   DIAGNOSTIC STUDIES: Oxygen Saturation is 100% on RA, normal by my interpretation.    COORDINATION OF CARE: 2:47 PM Discussed treatment plan with pt at bedside which includes hip XR and pt agreed to plan.   Labs (all labs ordered are listed, but only abnormal results are displayed) Labs Reviewed - No data to display  EKG  EKG Interpretation None       Radiology Dg Hip Unilat W Or Wo Pelvis 2-3 Views Right  Result Date: 09/10/2016 CLINICAL DATA:  Right hip pain, no known injury, initial encounter EXAM: DG HIP (WITH OR WITHOUT PELVIS) 2-3V RIGHT COMPARISON:  None. FINDINGS: Pelvic ring is intact. Significant degenerative changes of the right hip joint are noted with remodeling of the femoral head and acetabulum. Subchondral cyst formation and sclerosis is noted. No acute fracture or dislocation is  seen. No soft tissue abnormality is noted. IMPRESSION: Significant degenerative change without acute abnormality. Electronically Signed   By: Alcide CleverMark  Lukens M.D.   On: 09/10/2016 12:18    Procedures Procedures (including critical care time)  Medications Ordered in ED Medications - No data to display   Initial Impression / Assessment and Plan / ED Course  I have reviewed the triage vital signs and the nursing notes.  Pertinent labs & imaging results that were available during my care of the patient were reviewed by me and considered in my medical decision making (see chart for details).  Clinical Course    Pt with right hip pain.   Degenerative changesmseen on xr were discussed with pt.  She ambulates with a steady gait,  No focal neuro deficits, pain likely acute on chronic, doubt septic process.  Pt appears stable for d/c agrees to ortho f/u  Final Clinical Impressions(s) /  ED Diagnoses   Final diagnoses:  Acute pain of right hip    New Prescriptions New Prescriptions   No medications on file    I personally performed the services described in this documentation, which was scribed in my presence. The recorded information has been reviewed and is accurate.     Pauline Ausammy Keymora Grillot, PA-C 09/12/16 2122    Samuel JesterKathleen McManus, DO 09/14/16 2201

## 2016-09-10 NOTE — ED Triage Notes (Signed)
Patient c/o right hip pain. Denies any injury. Per patient has hx of arthritis in hip. Patient states "This past week it gave out on me." Patient states she is unable to tolerate bearing weight and when she does walk she feels a "poping sensation and a pinching of her nerve."

## 2016-09-10 NOTE — Discharge Instructions (Signed)
Alternate ice and heat to your hip.  Call the orthopedic doctor listed to arrange a follow-up appt

## 2016-09-10 NOTE — ED Notes (Signed)
ED Provider at bedside. 

## 2016-09-13 ENCOUNTER — Ambulatory Visit (INDEPENDENT_AMBULATORY_CARE_PROVIDER_SITE_OTHER): Payer: Self-pay | Admitting: Orthopaedic Surgery

## 2016-09-13 ENCOUNTER — Encounter: Payer: Self-pay | Admitting: Orthopedic Surgery

## 2016-09-13 VITALS — BP 128/83 | HR 95 | Ht 60.75 in | Wt 211.0 lb

## 2016-09-13 DIAGNOSIS — F17210 Nicotine dependence, cigarettes, uncomplicated: Secondary | ICD-10-CM

## 2016-09-13 DIAGNOSIS — I10 Essential (primary) hypertension: Secondary | ICD-10-CM

## 2016-09-13 DIAGNOSIS — M1611 Unilateral primary osteoarthritis, right hip: Secondary | ICD-10-CM

## 2016-09-13 MED ORDER — HYDROCODONE-ACETAMINOPHEN 7.5-325 MG PO TABS: ORAL_TABLET | ORAL | 0 refills | Status: DC

## 2016-09-13 NOTE — Patient Instructions (Addendum)
Rx: given for walker.  Surgery discussed.  Patient is to call when ready.  Out of work.  Steps to Quit Smoking Smoking tobacco can be bad for your health. It can also affect almost every organ in your body. Smoking puts you and people around you at risk for many serious long-lasting (chronic) diseases. Quitting smoking is hard, but it is one of the best things that you can do for your health. It is never too late to quit. What are the benefits of quitting smoking? When you quit smoking, you lower your risk for getting serious diseases and conditions. They can include:  Lung cancer or lung disease.  Heart disease.  Stroke.  Heart attack.  Not being able to have children (infertility).  Weak bones (osteoporosis) and broken bones (fractures). If you have coughing, wheezing, and shortness of breath, those symptoms may get better when you quit. You may also get sick less often. If you are pregnant, quitting smoking can help to lower your chances of having a baby of low birth weight. What can I do to help me quit smoking? Talk with your doctor about what can help you quit smoking. Some things you can do (strategies) include:  Quitting smoking totally, instead of slowly cutting back how much you smoke over a period of time.  Going to in-person counseling. You are more likely to quit if you go to many counseling sessions.  Using resources and support systems, such as:  Online chats with a Veterinary surgeoncounselor.  Phone quitlines.  Printed Materials engineerself-help materials.  Support groups or group counseling.  Text messaging programs.  Mobile phone apps or applications.  Taking medicines. Some of these medicines may have nicotine in them. If you are pregnant or breastfeeding, do not take any medicines to quit smoking unless your doctor says it is okay. Talk with your doctor about counseling or other things that can help you. Talk with your doctor about using more than one strategy at the same time, such as  taking medicines while you are also going to in-person counseling. This can help make quitting easier. What things can I do to make it easier to quit? Quitting smoking might feel very hard at first, but there is a lot that you can do to make it easier. Take these steps:  Talk to your family and friends. Ask them to support and encourage you.  Call phone quitlines, reach out to support groups, or work with a Veterinary surgeoncounselor.  Ask people who smoke to not smoke around you.  Avoid places that make you want (trigger) to smoke, such as:  Bars.  Parties.  Smoke-break areas at work.  Spend time with people who do not smoke.  Lower the stress in your life. Stress can make you want to smoke. Try these things to help your stress:  Getting regular exercise.  Deep-breathing exercises.  Yoga.  Meditating.  Doing a body scan. To do this, close your eyes, focus on one area of your body at a time from head to toe, and notice which parts of your body are tense. Try to relax the muscles in those areas.  Download or buy apps on your mobile phone or tablet that can help you stick to your quit plan. There are many free apps, such as QuitGuide from the [employer] developer(Centers for Disease Control and Prevention). You can find more support from smokefree.gov and other websites. This information is not intended to replace advice given to you by your health care provider. Make  sure you discuss any questions you have with your health care provider. Document Released: 07/29/2009 Document Revised: 05/30/2016 Document Reviewed: 02/16/2015 Elsevier Interactive Patient Education  2017 [employer].

## 2016-09-13 NOTE — Progress Notes (Signed)
Subjective:    Patient ID: Amber Acevedo, female    DOB: 04/28/1966, 50 y.o.   MRN: [id number]  HPI She has had pain in the right hip for several years.  It has gotten worse and worse until this past Thursday when she could no longer bare any weight on the leg. She went to the ER.  X-rays shows severe degenerative joint disease of the right hip with cyst formation, deformity of the hip and acetabulum with degenerative destruction of the joint and superior migration of the hip.  No fracture was seen.  She was given pain medicine and told to come here.  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  She has no trauma.  I have shown her the x-rays of her hip and explained the findings.  Her husband reviewed them as well.  I have told her she needs a total hip replacement.  I went over what the surgery would involve.  I showed her a model of the hip replacement.   She has no insurance.  I have [facility], also go to hospital and [facility].  I have asked her to inquire about Obamacare.  She will do these things.  I will give some pain medicine.  I have given Rx for walker.  She will think about the possible surgery and try to get financial assistance.  I will see her in two weeks.  She smokes.  I have told her to try to cut way back or even quit.  She will do much better after surgery if she is not smoking.  She is agreeable to this.  Review of Systems  HENT: Negative for congestion.   Respiratory: Negative for cough and shortness of breath.   Cardiovascular: Negative for chest pain and leg swelling.  Endocrine: Positive for cold intolerance.  Musculoskeletal: Positive for arthralgias, gait problem and joint swelling.  Allergic/Immunologic: Positive for environmental allergies.   Past Medical History:  Diagnosis Date  . Arthritis   . Diabetes mellitus without complication (HCC)   .  Hypertension   . Thyroid disease     Past Surgical History:  Procedure Laterality Date  . APPENDECTOMY    . CESAREAN SECTION    . KNEE SURGERY      Current Outpatient Prescriptions on File Prior to Visit  Medication Sig Dispense Refill  . Aspirin-Acetaminophen (GOODY BODY PAIN) 500-325 MG PACK Take 1 packet by mouth daily as needed.    . benztropine (COGENTIN) 1 MG tablet 1 tablet at bedtime.  0  . citalopram (CELEXA) 20 MG tablet 1 tablet daily.  0  . fluvoxaMINE (LUVOX) 100 MG tablet Take 1 tablet (100 mg total) by mouth at bedtime. 30 tablet 3  . haloperidol (HALDOL) 2 MG tablet Take 1 tablet (2 mg total) by mouth at bedtime. 30 tablet 3  . levothyroxine (SYNTHROID, LEVOTHROID) 200 MCG tablet Take 200 mcg by mouth daily before breakfast.     . methocarbamol (ROBAXIN) 500 MG tablet Take 1 tablet (500 mg total) by mouth 3 (three) times daily. 21 tablet 0  . naproxen (NAPROSYN) 500 MG tablet Take 1 tablet (500 mg total) by mouth 2 (two) times daily with a meal. 20 tablet 0  . prazosin (MINIPRESS) 2 MG capsule Take 1 capsule (2 mg total) by mouth 2 (two) times daily. (Patient taking differently: Take 2 mg by  mouth at bedtime. ) 60 capsule 3   No current facility-administered medications on file prior to visit.     Social History   Social History  . Marital status: Married    Spouse name: N/A  . Number of children: N/A  . Years of education: N/A   Occupational History  . Not on file.   Social History Main Topics  . Smoking status: Current Every Day Smoker    Packs/day: 1.00    Types: Cigarettes  . Smokeless tobacco: Never Used  . Alcohol use No  . Drug use: No  . Sexual activity: Not on file   Other Topics Concern  . Not on file   Social History Narrative  . No narrative on file    There is history of heart disease and hypertension in the family.  BP 128/83   Pulse 95   Ht 5' 0.75" (1.543 m)   Wt 211 lb (95.7 kg)   BMI 40.20 kg/m      Objective:    Physical Exam  Constitutional: She is oriented to person, place, and time. She appears well-developed and well-nourished.  HENT:  Head: Normocephalic and atraumatic.  Eyes: Conjunctivae and EOM are normal. Pupils are equal, round, and reactive to light.  Neck: Normal range of motion. Neck supple.  Cardiovascular: Normal rate, regular rhythm and intact distal pulses.   Pulmonary/Chest: Effort normal.  Abdominal: Soft.  Musculoskeletal: She exhibits tenderness (Pain and very limited motion of the right hip.  She can flex to 90 but only about 5 degrees of internal and external rotation.  Limp to the right.  Slight shortening on the right.  Left hip negative.  NV intact.).  Neurological: She is alert and oriented to person, place, and time. She displays normal reflexes. No cranial nerve deficit. She exhibits normal muscle tone. Coordination normal.  Skin: Skin is warm and dry.  Psychiatric: She has a normal mood and affect. Her behavior is normal. Judgment and thought content normal.          Assessment & Plan:   Encounter Diagnoses  Name Primary?  . Primary osteoarthritis of right hip Yes  . Essential hypertension   . Cigarette nicotine dependence without complication    Return in two weeks.  Get information about financial assistance.  Cut back on smoking.  Call if any problem.  Electronically Signed Amber McleanWayne Eleonora Peeler, MD 11/29/201710:52 AM

## 2016-09-27 ENCOUNTER — Ambulatory Visit (INDEPENDENT_AMBULATORY_CARE_PROVIDER_SITE_OTHER): Payer: Self-pay | Admitting: Orthopaedic Surgery

## 2016-09-27 ENCOUNTER — Encounter: Payer: Self-pay | Admitting: Orthopaedic Surgery

## 2016-09-27 VITALS — BP 127/84 | HR 88 | Temp 97.5°F | Ht 60.75 in | Wt 208.0 lb

## 2016-09-27 DIAGNOSIS — M1611 Unilateral primary osteoarthritis, right hip: Secondary | ICD-10-CM

## 2016-09-27 DIAGNOSIS — F17210 Nicotine dependence, cigarettes, uncomplicated: Secondary | ICD-10-CM

## 2016-09-27 DIAGNOSIS — I10 Essential (primary) hypertension: Secondary | ICD-10-CM

## 2016-09-27 MED ORDER — HYDROCODONE-ACETAMINOPHEN 7.5-325 MG PO TABS: 1.0000 | ORAL_TABLET | ORAL | 0 refills | Status: DC | PRN

## 2016-09-27 NOTE — Patient Instructions (Addendum)
Steps to Quit Smoking Smoking tobacco can be bad for your health. It can also affect almost every organ in your body. Smoking puts you and people around you at risk for many serious Sa[place]as Hidrogo-lasting (chronic) diseases. Quitting smoking is hard, but it is one of the best things that you can do for your health. It is never too late to quit. What are the benefits of quitting smoking? When you quit smoking, you lower your risk for getting serious diseases and conditions. They can include:  Lung cancer or lung disease.  Heart disease.  Stroke.  Heart attack.  Not being able to have children (infertility).  Weak bones (osteoporosis) and broken bones (fractures). If you have coughing, wheezing, and shortness of breath, those symptoms may get better when you quit. You may also get sick less often. If you are pregnant, quitting smoking can help to lower your chances of having a baby of low birth weight. What can I do to help me quit smoking? Talk with your doctor about what can help you quit smoking. Some things you can do (strategies) include:  Quitting smoking totally, instead of slowly cutting back how much you smoke over a period of time.  Going to in-person counseling. You are more likely to quit if you go to many counseling sessions.  Using resources and support systems, such as:  Online chats with a counselor.  Phone quitlines.  Printed self-help materials.  Support groups or group counseling.  Text messaging programs.  Mobile phone apps or applications.  Taking medicines. Some of these medicines may have nicotine in them. If you are pregnant or breastfeeding, do not take any medicines to quit smoking unless your doctor says it is okay. Talk with your doctor about counseling or other things that can help you. Talk with your doctor about using more than one strategy at the same time, such as taking medicines while you are also going to in-person counseling. This can help make quitting  easier. What things can I do to make it easier to quit? Quitting smoking might feel very hard at first, but there is a lot that you can do to make it easier. Take these steps:  Talk to your family and friends. Ask them to support and encourage you.  Call phone quitlines, reach out to support groups, or work with a counselor.  Ask people who smoke to not smoke around you.  Avoid places that make you want (trigger) to smoke, such as:  Bars.  Parties.  Smoke-break areas at work.  Spend time with people who do not smoke.  Lower the stress in your life. Stress can make you want to smoke. Try these things to help your stress:  Getting regular exercise.  Deep-breathing exercises.  Yoga.  Meditating.  Doing a body scan. To do this, close your eyes, focus on one area of your body at a time from head to toe, and notice which parts of your body are tense. Try to relax the muscles in those areas.  Download or buy apps on your mobile phone or tablet that can help you stick to your quit plan. There are many free apps, such as QuitGuide from the CDC (Centers for Disease Control and Prevention). You can find more support from smokefree.gov and other websites. This information is not intended to replace advice given to you by your health care provider. Make sure you discuss any questions you have with your health care provider. Document Released: 07/29/2009 Document Revised: 05/30/2016 Document   Reviewed: 02/16/2015 Elsevier Interactive Patient Education  2017 [employer] Inc.  

## 2016-09-27 NOTE — Progress Notes (Signed)
Patient WU:[id number]:Amber Acevedo, female DOB:[birth year], 50 y.o. YNW:[id number]  Chief Complaint  Patient presents with  . Follow-up    Right hip pain    HPI  Amber Acevedo is a 50 y.o. female who has severe degenerative changes of the right hip.  She needs a total hip.  She has no insurance.  [facility]  She continues pain.  She has a cane, no new trauma. HPI  Body mass index is 39.63 kg/m.  ROS  Review of Systems  HENT: Negative for congestion.   Respiratory: Negative for cough and shortness of breath.   Cardiovascular: Negative for chest pain and leg swelling.  Endocrine: Positive for cold intolerance.  Musculoskeletal: Positive for arthralgias, gait problem and joint swelling.  Allergic/Immunologic: Positive for environmental allergies.    Past Medical History:  Diagnosis Date  . Arthritis   . Diabetes mellitus without complication (HCC)   . Hypertension   . Thyroid disease     Past Surgical History:  Procedure Laterality Date  . APPENDECTOMY    . CESAREAN SECTION    . KNEE SURGERY      No family history on file.  Social History Social History  Substance Use Topics  . Smoking status: Current Every Day Smoker    Packs/day: 1.00    Types: Cigarettes  . Smokeless tobacco: Never Used  . Alcohol use No    Allergies  Allergen Reactions  . Amoxicillin     Current Outpatient Prescriptions  Medication Sig Dispense Refill  . Aspirin-Acetaminophen (GOODY BODY PAIN) 500-325 MG PACK Take 1 packet by mouth daily as needed.    . benztropine (COGENTIN) 1 MG tablet 1 tablet at bedtime.  0  . citalopram (CELEXA) 20 MG tablet 1 tablet daily.  0  . fluvoxaMINE (LUVOX) 100 MG tablet Take 1 tablet (100 mg total) by mouth at bedtime. 30 tablet 3  . haloperidol (HALDOL) 2 MG tablet Take 1 tablet (2 mg total) by mouth at bedtime. 30 tablet 3  . HYDROcodone-acetaminophen (NORCO) 7.5-325 MG tablet Take 1 tablet by mouth every 4 (four) hours as needed for moderate  pain (Must last 30 days.  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 120 tablet 0  . levothyroxine (SYNTHROID, LEVOTHROID) 200 MCG tablet Take 200 mcg by mouth daily before breakfast.     . methocarbamol (ROBAXIN) 500 MG tablet Take 1 tablet (500 mg total) by mouth 3 (three) times daily. 21 tablet 0  . naproxen (NAPROSYN) 500 MG tablet Take 1 tablet (500 mg total) by mouth 2 (two) times daily with a meal. 20 tablet 0  . prazosin (MINIPRESS) 2 MG capsule Take 1 capsule (2 mg total) by mouth 2 (two) times daily. (Patient taking differently: Take 2 mg by mouth at bedtime. ) 60 capsule 3   No current facility-administered medications for this visit.      Physical Exam  Blood pressure 127/84, pulse 88, temperature 97.5 F (36.4 C), height 5' 0.75" (1.543 m), weight 208 lb (94.3 kg).  Constitutional: overall normal hygiene, normal nutrition, well developed, normal grooming, normal body habitus. Assistive device:cane  Musculoskeletal: gait and station Limp right, muscle tone and strength are normal, no tremors or atrophy is present.  .  Neurological: coordination overall normal.  Deep tendon reflex/nerve stretch intact.  Sensation normal.  Cranial nerves II-XII intact.   Skin:   Normal overall no scars, lesions, ulcers or rashes. No psoriasis.  Psychiatric: Alert and oriented  x 3.  Recent memory intact, remote memory unclear.  Normal mood and affect. Well groomed.  Good eye contact.  Cardiovascular: overall no swelling, no varicosities, no edema bilaterally, normal temperatures of the legs and arms, no clubbing, cyanosis and good capillary refill.  Lymphatic: palpation is normal.  Right hip flexes to 90 but has very limited internal and external motion as well as abduction and adduction.  Limp to the right.  The patient has been educated about the nature of the problem(s) and counseled on treatment options.  The patient appeared to understand what I have discussed and  is in agreement with it.  Encounter Diagnoses  Name Primary?  . Primary osteoarthritis of right hip Yes  . Essential hypertension   . Cigarette nicotine dependence without complication     PLAN Call if any problems.  Precautions discussed.  Continue current medications.   Return to clinic 1 month   .wxk

## 2016-10-25 ENCOUNTER — Ambulatory Visit: Payer: Self-pay | Admitting: Orthopaedic Surgery

## 2016-11-01 ENCOUNTER — Ambulatory Visit: Payer: Self-pay | Admitting: Orthopaedic Surgery

## 2016-11-07 ENCOUNTER — Ambulatory Visit (INDEPENDENT_AMBULATORY_CARE_PROVIDER_SITE_OTHER): Payer: Self-pay | Admitting: Orthopaedic Surgery

## 2016-11-07 ENCOUNTER — Encounter: Payer: Self-pay | Admitting: Orthopaedic Surgery

## 2016-11-07 VITALS — BP 105/74 | HR 94 | Temp 97.7°F | Ht 60.75 in | Wt 209.0 lb

## 2016-11-07 DIAGNOSIS — F17210 Nicotine dependence, cigarettes, uncomplicated: Secondary | ICD-10-CM

## 2016-11-07 DIAGNOSIS — M1611 Unilateral primary osteoarthritis, right hip: Secondary | ICD-10-CM

## 2016-11-07 DIAGNOSIS — I10 Essential (primary) hypertension: Secondary | ICD-10-CM

## 2016-11-07 MED ORDER — HYDROCODONE-ACETAMINOPHEN 7.5-325 MG PO TABS: 1.0000 | ORAL_TABLET | ORAL | 0 refills | Status: DC | PRN

## 2016-11-07 NOTE — Patient Instructions (Signed)
Steps to Quit Smoking Smoking tobacco can be bad for your health. It can also affect almost every organ in your body. Smoking puts you and people around you at risk for many serious Anias Bartol-lasting (chronic) diseases. Quitting smoking is hard, but it is one of the best things that you can do for your health. It is never too late to quit. What are the benefits of quitting smoking? When you quit smoking, you lower your risk for getting serious diseases and conditions. They can include:  Lung cancer or lung disease.  Heart disease.  Stroke.  Heart attack.  Not being able to have children (infertility).  Weak bones (osteoporosis) and broken bones (fractures). If you have coughing, wheezing, and shortness of breath, those symptoms may get better when you quit. You may also get sick less often. If you are pregnant, quitting smoking can help to lower your chances of having a baby of low birth weight. What can I do to help me quit smoking? Talk with your doctor about what can help you quit smoking. Some things you can do (strategies) include:  Quitting smoking totally, instead of slowly cutting back how much you smoke over a period of time.  Going to in-person counseling. You are more likely to quit if you go to many counseling sessions.  Using resources and support systems, such as:  Online chats with a counselor.  Phone quitlines.  Printed self-help materials.  Support groups or group counseling.  Text messaging programs.  Mobile phone apps or applications.  Taking medicines. Some of these medicines may have nicotine in them. If you are pregnant or breastfeeding, do not take any medicines to quit smoking unless your doctor says it is okay. Talk with your doctor about counseling or other things that can help you. Talk with your doctor about using more than one strategy at the same time, such as taking medicines while you are also going to in-person counseling. This can help make quitting  easier. What things can I do to make it easier to quit? Quitting smoking might feel very hard at first, but there is a lot that you can do to make it easier. Take these steps:  Talk to your family and friends. Ask them to support and encourage you.  Call phone quitlines, reach out to support groups, or work with a counselor.  Ask people who smoke to not smoke around you.  Avoid places that make you want (trigger) to smoke, such as:  Bars.  Parties.  Smoke-break areas at work.  Spend time with people who do not smoke.  Lower the stress in your life. Stress can make you want to smoke. Try these things to help your stress:  Getting regular exercise.  Deep-breathing exercises.  Yoga.  Meditating.  Doing a body scan. To do this, close your eyes, focus on one area of your body at a time from head to toe, and notice which parts of your body are tense. Try to relax the muscles in those areas.  Download or buy apps on your mobile phone or tablet that can help you stick to your quit plan. There are many free apps, such as QuitGuide from the CDC (Centers for Disease Control and Prevention). You can find more support from smokefree.gov and other websites. This information is not intended to replace advice given to you by your health care provider. Make sure you discuss any questions you have with your health care provider. Document Released: 07/29/2009 Document Revised: 05/30/2016 Document   Reviewed: 02/16/2015 Elsevier Interactive Patient Education  2017 Elsevier Inc.  

## 2016-11-07 NOTE — Progress Notes (Signed)
Patient Amber Acevedo, female DOB:[birth year], 51 y.o. JXB:[id number]  Chief Complaint  Patient presents with  . Follow-up    osteoarthritis of right hip    HPI  Amber Acevedo is a 51 y.o. female who has severe osteoarthritis of the right hip.  She has no insurance but has Medicaid application in and is awaiting to hear from them. She has no new trauma.  She needs a total hip. HPI  Body mass index is 39.82 kg/m.  ROS  Review of Systems  HENT: Negative for congestion.   Respiratory: Negative for cough and shortness of breath.   Cardiovascular: Negative for chest pain and leg swelling.  Endocrine: Positive for cold intolerance.  Musculoskeletal: Positive for arthralgias, gait problem and joint swelling.  Allergic/Immunologic: Positive for environmental allergies.    Past Medical History:  Diagnosis Date  . Arthritis   . Diabetes mellitus without complication (HCC)   . Hypertension   . Thyroid disease     Past Surgical History:  Procedure Laterality Date  . APPENDECTOMY    . CESAREAN SECTION    . KNEE SURGERY      No family history on file.  Social History Social History  Substance Use Topics  . Smoking status: Current Every Day Smoker    Packs/day: 1.00    Types: Cigarettes  . Smokeless tobacco: Never Used  . Alcohol use No    Allergies  Allergen Reactions  . Amoxicillin     Current Outpatient Prescriptions  Medication Sig Dispense Refill  . Aspirin-Acetaminophen (GOODY BODY PAIN) 500-325 MG PACK Take 1 packet by mouth daily as needed.    . benztropine (COGENTIN) 1 MG tablet 1 tablet at bedtime.  0  . citalopram (CELEXA) 20 MG tablet 1 tablet daily.  0  . fluvoxaMINE (LUVOX) 100 MG tablet Take 1 tablet (100 mg total) by mouth at bedtime. 30 tablet 3  . haloperidol (HALDOL) 2 MG tablet Take 1 tablet (2 mg total) by mouth at bedtime. 30 tablet 3  . HYDROcodone-acetaminophen (NORCO) 7.5-325 MG tablet Take 1 tablet by mouth every 4 (four) hours as needed  for moderate pain (Must last 30 days.  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 120 tablet 0  . levothyroxine (SYNTHROID, LEVOTHROID) 200 MCG tablet Take 200 mcg by mouth daily before breakfast.     . methocarbamol (ROBAXIN) 500 MG tablet Take 1 tablet (500 mg total) by mouth 3 (three) times daily. 21 tablet 0  . naproxen (NAPROSYN) 500 MG tablet Take 1 tablet (500 mg total) by mouth 2 (two) times daily with a meal. 20 tablet 0  . prazosin (MINIPRESS) 2 MG capsule Take 1 capsule (2 mg total) by mouth 2 (two) times daily. (Patient taking differently: Take 2 mg by mouth at bedtime. ) 60 capsule 3   No current facility-administered medications for this visit.      Physical Exam  Blood pressure 105/74, pulse 94, temperature 97.7 F (36.5 C), height 5' 0.75" (1.543 m), weight 209 lb (94.8 kg).  Constitutional: overall normal hygiene, normal nutrition, well developed, normal grooming, normal body habitus. Assistive device:cane  Musculoskeletal: gait and station Limp right, muscle tone and strength are normal, no tremors or atrophy is present.  .  Neurological: coordination overall normal.  Deep tendon reflex/nerve stretch intact.  Sensation normal.  Cranial nerves II-XII intact.   Skin:   Normal overall no scars, lesions, ulcers or rashes. No psoriasis.  Psychiatric: Alert and oriented x  3.  Recent memory intact, remote memory unclear.  Normal mood and affect. Well groomed.  Good eye contact.  Cardiovascular: overall no swelling, no varicosities, no edema bilaterally, normal temperatures of the legs and arms, no clubbing, cyanosis and good capillary refill.  Lymphatic: palpation is normal.  She has marked decreased motion of the right hip and uses a cane. She has a limp to the right.  The patient has been educated about the nature of the problem(s) and counseled on treatment options.  The patient appeared to understand what I have discussed and is in agreement with  it.  Encounter Diagnoses  Name Primary?  . Primary osteoarthritis of right hip Yes  . Essential hypertension   . Cigarette nicotine dependence without complication     PLAN Call if any problems.  Precautions discussed.  Continue current medications.   Return to clinic 1 month Pain medicine refilled.  I checked the state narcotic site first.  Electronically Signed Darreld McleanWayne Alejo Beamer, MD 1/23/201810:30 AM

## 2016-12-05 ENCOUNTER — Ambulatory Visit: Payer: Self-pay | Admitting: Orthopaedic Surgery

## 2016-12-21 ENCOUNTER — Telehealth: Payer: Self-pay | Admitting: Radiology

## 2016-12-21 NOTE — Telephone Encounter (Signed)
Patient needs refill on Hydrocodone °

## 2016-12-25 MED ORDER — HYDROCODONE-ACETAMINOPHEN 7.5-325 MG PO TABS: 1.0000 | ORAL_TABLET | ORAL | 0 refills | Status: DC | PRN

## 2017-02-11 IMAGING — DX DG HIP (WITH OR WITHOUT PELVIS) 2-3V*R*
3 series · 3 of 3 positions shown · non-contrast
Comparison: None.

CLINICAL DATA: Right hip pain, no known injury, initial encounter

EXAM:
DG HIP (WITH OR WITHOUT PELVIS) 2-3V RIGHT

[pelvis ap]
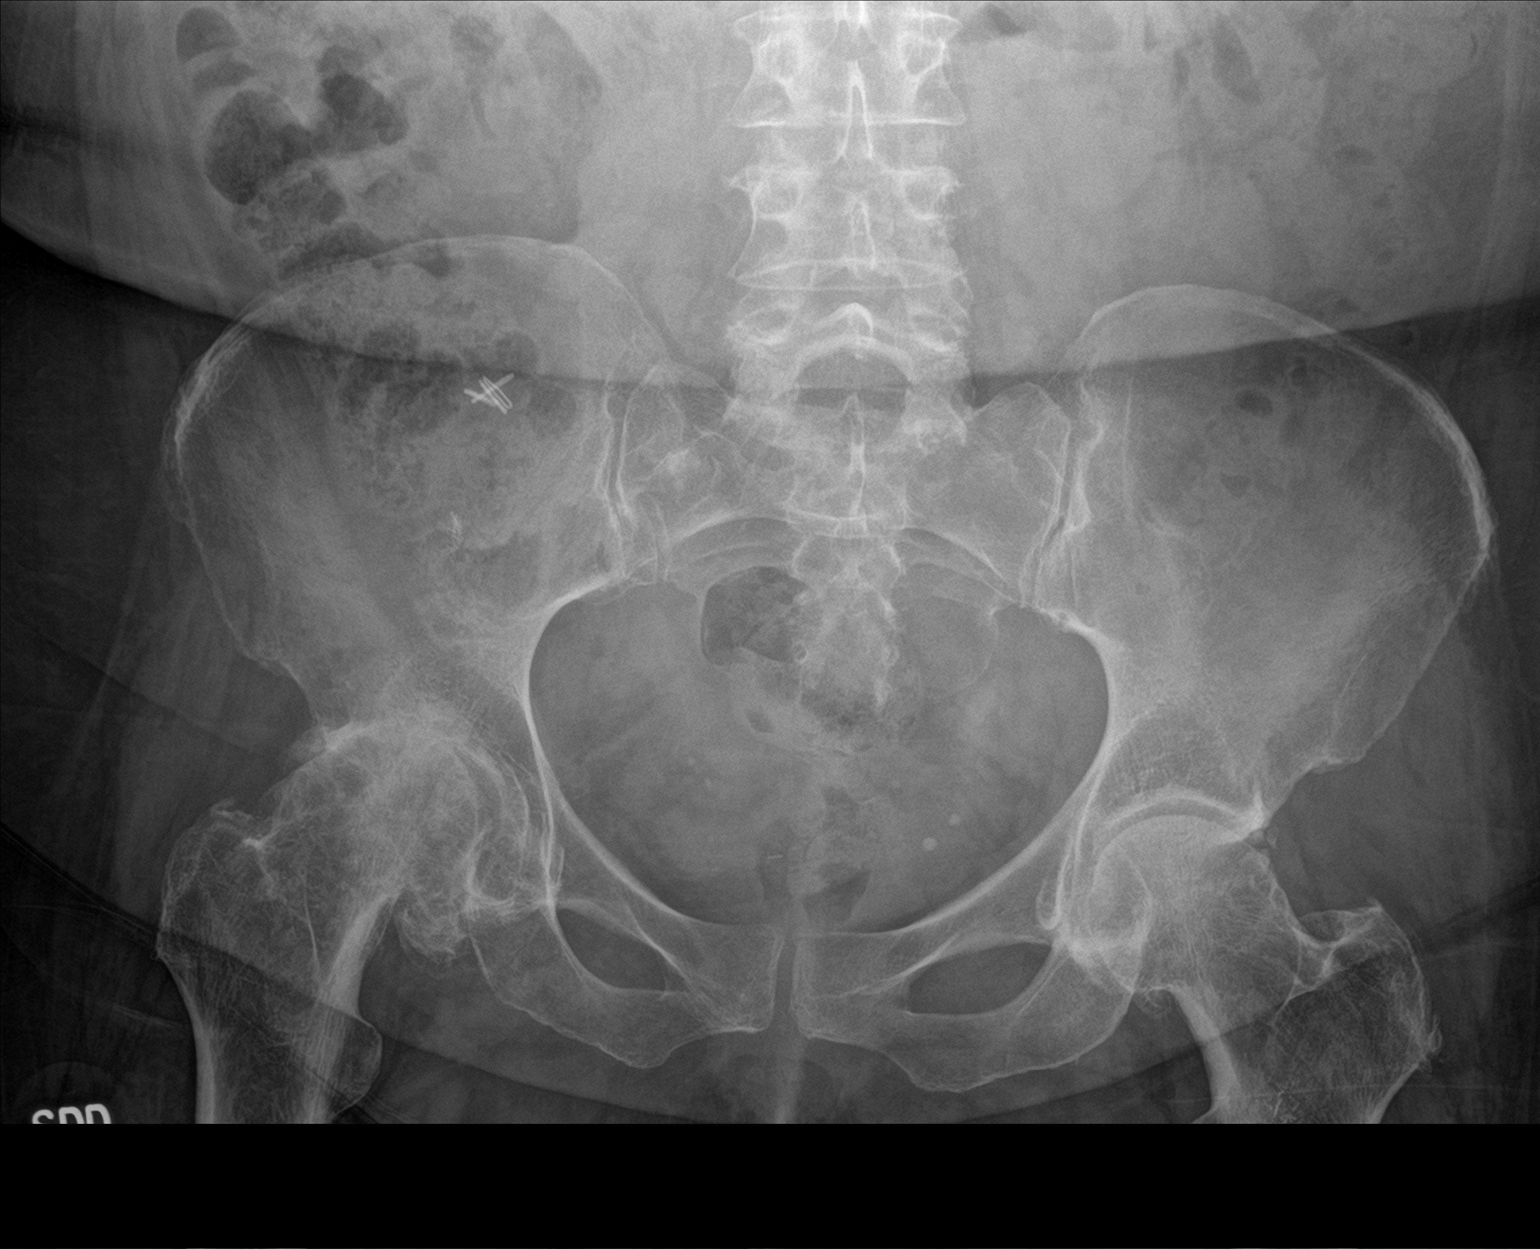

[hip ap]
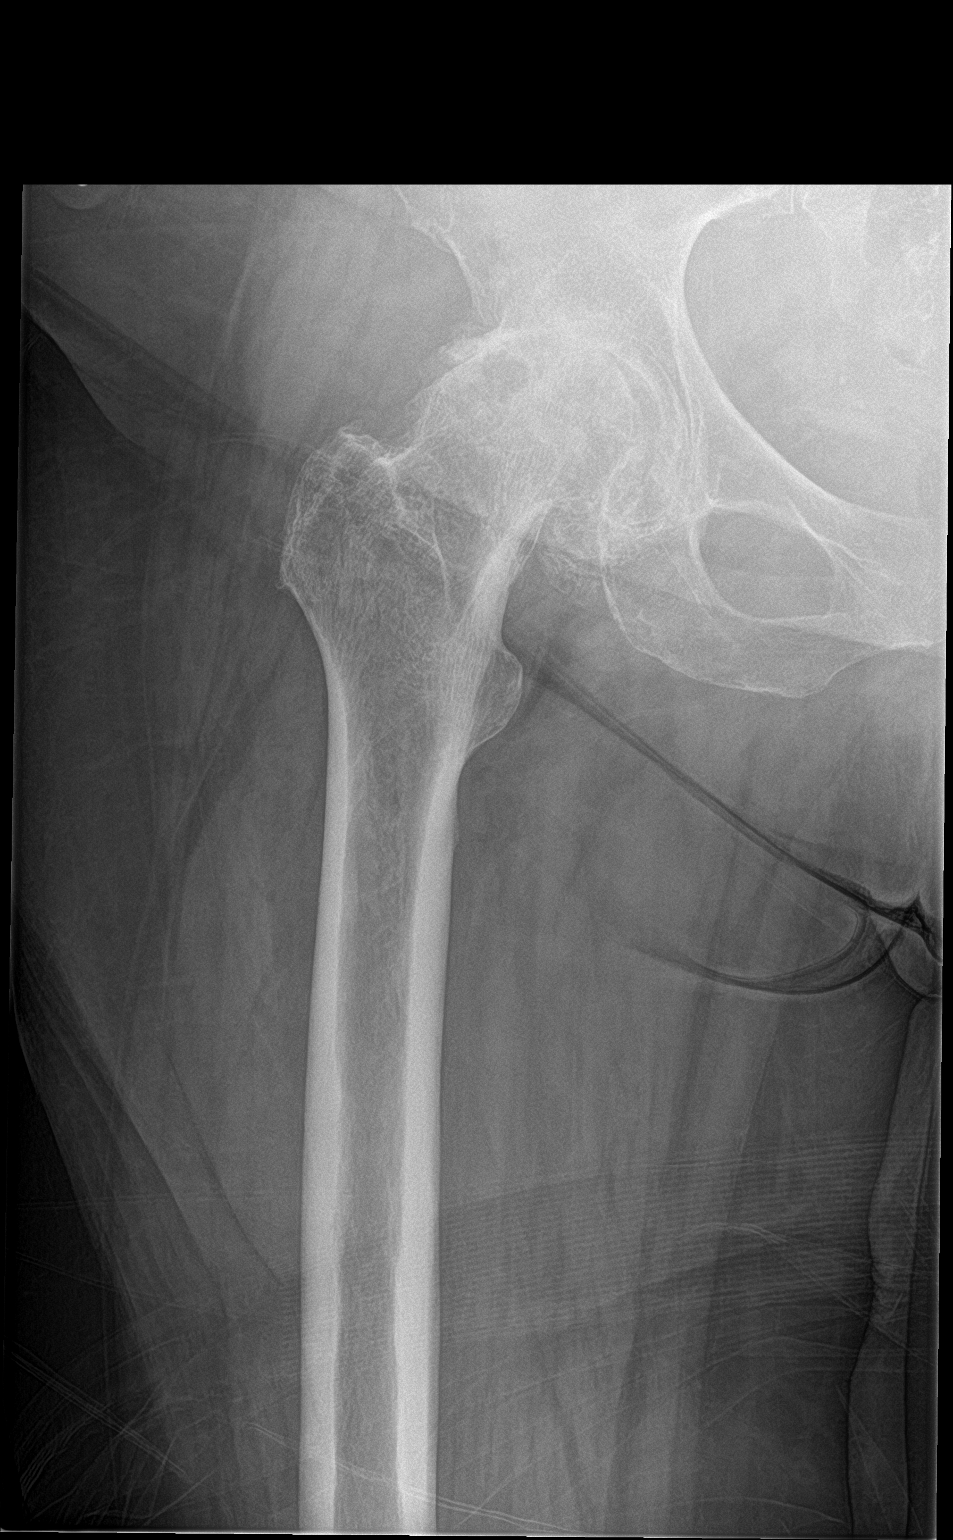

[hip lat]
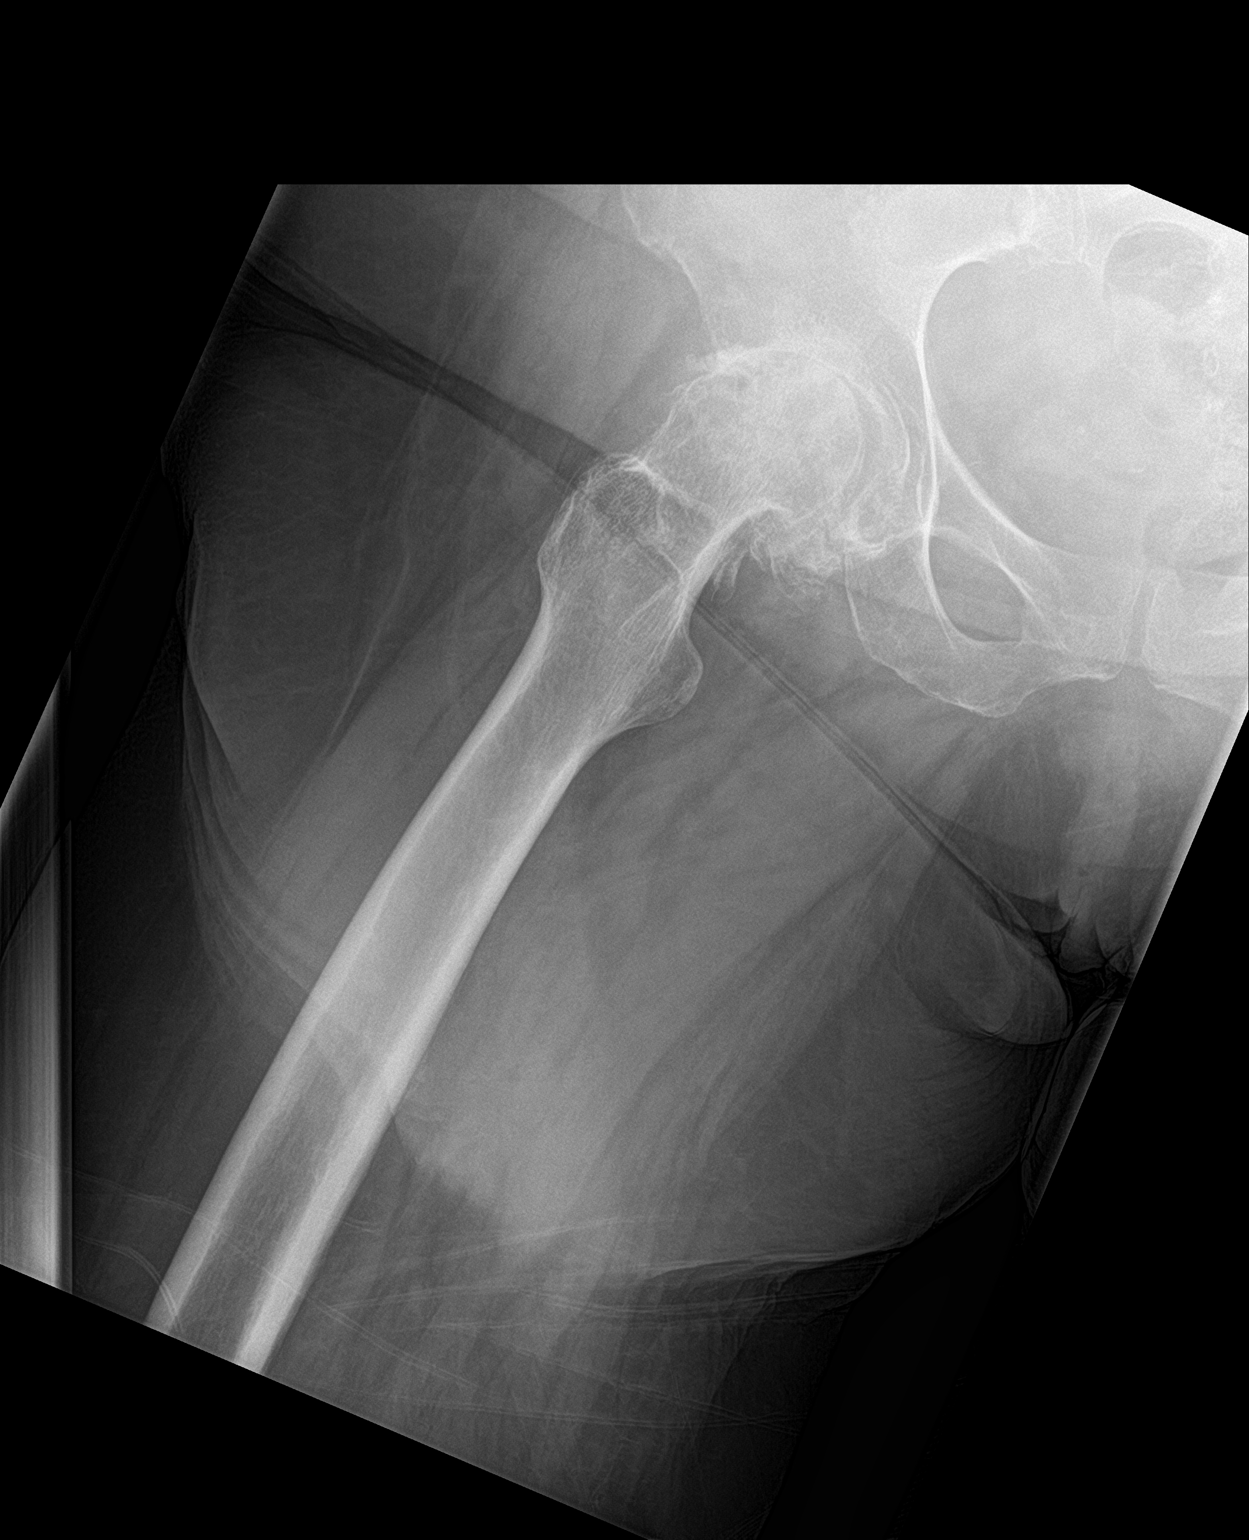

[3 of 3 positions shown; findings below may reference images not displayed]

FINDINGS: Pelvic ring is intact. Significant degenerative changes of the right
hip joint are noted with remodeling of the femoral head and
acetabulum. Subchondral cyst formation and sclerosis is noted. No
acute fracture or dislocation is seen. No soft tissue abnormality is
noted.
IMPRESSION: Significant degenerative change without acute abnormality.

## 2019-03-07 ENCOUNTER — Inpatient Hospital Stay: Admission: RE | Admit: 2019-03-07 | Payer: Self-pay | Source: Ambulatory Visit

## 2019-10-27 ENCOUNTER — Ambulatory Visit: Payer: Self-pay

## 2019-10-29 ENCOUNTER — Other Ambulatory Visit: Payer: Self-pay

## 2019-10-31 ENCOUNTER — Other Ambulatory Visit: Payer: Self-pay

## 2019-10-31 ENCOUNTER — Ambulatory Visit: Payer: Medicare Other | Attending: Internal Medicine

## 2019-10-31 DIAGNOSIS — Z20822 Contact with and (suspected) exposure to covid-19: Secondary | ICD-10-CM

## 2019-11-01 LAB — NOVEL CORONAVIRUS, NAA: SARS-CoV-2, NAA: NOT DETECTED

## 2019-11-10 ENCOUNTER — Telehealth: Payer: Self-pay | Admitting: *Deleted

## 2019-11-10 NOTE — Telephone Encounter (Signed)
[name] asking for her husband's results to be faxed to his PT/Rehab facility in [place] so he can go back to get therapy. His name is Amber Acevedo, DOB 06/04/1966. Message given to Lamar Blinks who is in office today.

## 2021-11-23 ENCOUNTER — Ambulatory Visit: Payer: Medicare Other | Admitting: Internal Medicine

## 2022-02-13 ENCOUNTER — Ambulatory Visit: Payer: Medicare Other | Admitting: Internal Medicine

## 2022-03-14 ENCOUNTER — Ambulatory Visit: Payer: Medicare Other | Admitting: Internal Medicine

## 2022-03-20 ENCOUNTER — Encounter: Payer: Self-pay | Admitting: Family Medicine

## 2022-03-20 ENCOUNTER — Ambulatory Visit (INDEPENDENT_AMBULATORY_CARE_PROVIDER_SITE_OTHER): Payer: Medicare Other | Admitting: Family Medicine

## 2022-03-20 VITALS — BP 153/88 | HR 110 | Ht 60.0 in | Wt 332.0 lb

## 2022-03-20 DIAGNOSIS — F333 Major depressive disorder, recurrent, severe with psychotic symptoms: Secondary | ICD-10-CM

## 2022-03-20 DIAGNOSIS — E559 Vitamin D deficiency, unspecified: Secondary | ICD-10-CM

## 2022-03-20 DIAGNOSIS — Z1211 Encounter for screening for malignant neoplasm of colon: Secondary | ICD-10-CM

## 2022-03-20 DIAGNOSIS — M1611 Unilateral primary osteoarthritis, right hip: Secondary | ICD-10-CM | POA: Diagnosis not present

## 2022-03-20 DIAGNOSIS — Z23 Encounter for immunization: Secondary | ICD-10-CM

## 2022-03-20 DIAGNOSIS — Z1159 Encounter for screening for other viral diseases: Secondary | ICD-10-CM

## 2022-03-20 DIAGNOSIS — Z1231 Encounter for screening mammogram for malignant neoplasm of breast: Secondary | ICD-10-CM | POA: Diagnosis not present

## 2022-03-20 DIAGNOSIS — I10 Essential (primary) hypertension: Secondary | ICD-10-CM

## 2022-03-20 DIAGNOSIS — Z114 Encounter for screening for human immunodeficiency virus [HIV]: Secondary | ICD-10-CM

## 2022-03-20 DIAGNOSIS — R7301 Impaired fasting glucose: Secondary | ICD-10-CM

## 2022-03-20 DIAGNOSIS — M25551 Pain in right hip: Secondary | ICD-10-CM

## 2022-03-20 MED ORDER — MELOXICAM 7.5 MG PO TABS: 7.5000 mg | ORAL_TABLET | Freq: Every day | ORAL | 0 refills | Status: DC

## 2022-03-20 NOTE — Progress Notes (Signed)
New Patient Office Visit  Subjective:  Patient ID: Amber Acevedo, female    DOB: 1965/11/06  Age: 56 y.o. MRN: [id number]  CC:  Chief Complaint  Patient presents with   New Patient (Initial Visit)    Pt establishing care, pt states she is due for right hip surgery, would like to discuss hip pain that's been ongoing since 2012, would like to check thyroid levels, alc levels and discuss bp medication refills.     HPI  Amber Acevedo is a 56 y.o. female with past medical history of HTN presents for establishing car with c/o of right hip pain.  Hip pain: she was seeing Dr. Luna Glasgow for her osteoarthritis with her last office visit on 11/07/2016. She had an x-ray of her right hip on 09/10/2016, which shows severe degenerative joint disease of the right hip with cyst formation, deformity of the hip, and acetabulum with degenerative destruction of the joint and superior migration of the hip.No fracture was seen. She reports that Dr. Luna Glasgow recommended right hip surgery, but she didn't have insurance then. At the time when she had insurance in 2020, her husband had a severe stroke, and she had to take care of him. Pain is rated 7/10 when sitting and 9-10/10 when ambulating. She reports taking motrin $RemoveBefor'200mg'beVxrzOjERha$  q4-6hrs as needed for pain. She uses her walker and reports ambulating very short distances without it. 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 She reports quitting smoking on April 06, 2019.  White coat syndrome: elevated BP today. She reports being nervous about her appt today. She reports never being diagnosed with hypertension in the past. She mentions being nervous about coming to the doctor's office.     Past Medical History:  Diagnosis Date   Arthritis    Diabetes mellitus without complication ([place])    Hypertension    Thyroid disease     Past Surgical History:  Procedure Laterality Date   APPENDECTOMY     CESAREAN SECTION     KNEE SURGERY      History reviewed. No pertinent  family history.  Social History   Socioeconomic History   Marital status: Married    Spouse name: Not on file   Number of children: Not on file   Years of education: Not on file   Highest education level: Not on file  Occupational History   Not on file  Tobacco Use   Smoking status: Former    Packs/day: 1.00    Types: Cigarettes   Smokeless tobacco: Never  Substance and Sexual Activity   Alcohol use: No   Drug use: No   Sexual activity: Not on file  Other Topics Concern   Not on file  Social History Narrative   Not on file   Social Determinants of Health   Financial Resource Strain: Not on file  Food Insecurity: Not on file  Transportation Needs: Not on file  Physical Activity: Not on file  Stress: Not on file  Social Connections: Not on file  Intimate Partner Violence: Not on file    ROS Review of Systems  Constitutional:  Positive for fatigue. Negative for chills and fever.  HENT:  Negative for sinus pressure, sneezing and sore throat.   Eyes:  Negative for pain and redness.  Respiratory:  Negative for chest tightness, shortness of breath and wheezing.   Cardiovascular:  Negative for chest pain and palpitations.  Gastrointestinal:  Negative for diarrhea, nausea and vomiting.  Endocrine: Negative for polydipsia, polyphagia  and polyuria.  Genitourinary:  Negative for difficulty urinating, dyspareunia and dysuria.  Musculoskeletal:  Positive for arthralgias. Negative for neck pain and neck stiffness.  Skin:  Negative for pallor and wound.  Neurological:  Negative for syncope and speech difficulty.  Hematological:  Bruises/bleeds easily.  Psychiatric/Behavioral:  Negative for self-injury and suicidal ideas.    Objective:   Today's Vitals: BP (!) 153/88 (BP Location: Left Arm)   Pulse (!) 110   Ht 5' (1.524 m)   Wt (!) 332 lb (150.6 kg)   SpO2 98%   BMI 64.84 kg/m   Physical Exam Constitutional:      Appearance: Normal appearance.  HENT:     Head:  Normocephalic.     Right Ear: External ear normal.     Left Ear: External ear normal.     Nose: No congestion or rhinorrhea.     Mouth/Throat:     Mouth: Mucous membranes are moist.  Eyes:     Extraocular Movements: Extraocular movements intact.     Pupils: Pupils are equal, round, and reactive to light.  Cardiovascular:     Rate and Rhythm: Normal rate and regular rhythm.     Pulses: Normal pulses.     Heart sounds: Normal heart sounds.  Pulmonary:     Effort: Pulmonary effort is normal.  Abdominal:     Palpations: Abdomen is soft.  Musculoskeletal:     Cervical back: No rigidity.     Right lower leg: No edema.     Left lower leg: No edema.  Skin:    General: Skin is warm.     Findings: Bruising present.  Neurological:     Mental Status: She is alert and oriented to person, place, and time.  Psychiatric:     Comments: Normal affect    Assessment & Plan:   Problem List Items Addressed This Visit       Cardiovascular and Mediastinum   HTN (hypertension)    -elevated BP today -likely due to white coat syndrome -recommended monitoring BP daily and bringing ambulatory readings at her next appointment -f/u in 1 monthn -reviewed Dash's eating plan and limiting salt intake       Relevant Orders   CBC with Differential/Platelet   CMP14+EGFR   Lipid panel   TSH + free T4     Musculoskeletal and Integument   Primary osteoarthritis of right hip - Primary    -was following up with Dr. Luna Glasgow -x-ray on 09/10/2016 shows severe degenerative joint disease of the right hip with cyst formation, deformity of the hip, and acetabulum with degenerative destruction of the joint and superior migration of the hip.No fracture was seen  -will place a referral to orthopedics and start pt on melixicam       Relevant Medications   meloxicam (MOBIC) 7.5 MG tablet   Other Relevant Orders   Ambulatory referral to Orthopedic Surgery     Other   Major depressive disorder, recurrent  episode, severe with mood-congruent psychotic features ([place])    - follow up with behavioral health at [facility] -next appt is in July         RESOLVED: Right hip pain   Other Visit Diagnoses     Colon cancer screening       Relevant Orders   Cologuard   Breast cancer screening by mammogram       Relevant Orders   MM 3D SCREEN BREAST BILATERAL   Immunization due       Encounter  for screening for HIV       Relevant Orders   HIV antibody (with reflex)   Need for hepatitis C screening test       Relevant Orders   Hepatitis C antibody   Vitamin D deficiency       Relevant Orders   Vitamin D (25 hydroxy)   IFG (impaired fasting glucose)       Relevant Orders   Hemoglobin A1c       Outpatient Encounter Medications as of 03/20/2022  Medication Sig   benztropine (COGENTIN) 1 MG tablet 1 tablet at bedtime.   citalopram (CELEXA) 20 MG tablet 1 tablet daily.   meloxicam (MOBIC) 7.5 MG tablet Take 1 tablet (7.5 mg total) by mouth daily.   [DISCONTINUED] Aspirin-Acetaminophen 500-325 MG PACK Take 1 packet by mouth daily as needed.   methocarbamol (ROBAXIN) 500 MG tablet Take 1 tablet (500 mg total) by mouth 3 (three) times daily. (Patient not taking: Reported on 03/20/2022)   risperiDONE (RISPERDAL) 2 MG tablet Take 2 mg by mouth at bedtime.   [DISCONTINUED] fluvoxaMINE (LUVOX) 100 MG tablet Take 1 tablet (100 mg total) by mouth at bedtime. (Patient not taking: Reported on 03/20/2022)   [DISCONTINUED] haloperidol (HALDOL) 2 MG tablet Take 1 tablet (2 mg total) by mouth at bedtime. (Patient not taking: Reported on 03/20/2022)   [DISCONTINUED] HYDROcodone-acetaminophen (NORCO) 7.5-325 MG tablet Take 1 tablet by mouth every 4 (four) hours as needed for moderate pain (Must last 30 days.  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 (Patient not taking: Reported on 03/20/2022)   [DISCONTINUED] levothyroxine (SYNTHROID, LEVOTHROID) 200 MCG tablet Take 200 mcg by mouth daily before  breakfast.  (Patient not taking: Reported on 03/20/2022)   [DISCONTINUED] naproxen (NAPROSYN) 500 MG tablet Take 1 tablet (500 mg total) by mouth 2 (two) times daily with a meal. (Patient not taking: Reported on 03/20/2022)   [DISCONTINUED] prazosin (MINIPRESS) 2 MG capsule Take 1 capsule (2 mg total) by mouth 2 (two) times daily. (Patient not taking: Reported on 03/20/2022)   No facility-administered encounter medications on file as of 03/20/2022.    Follow-up: Return in about 1 month (around 04/19/2022) for BP.   Alvira Monday, FNP

## 2022-03-20 NOTE — Assessment & Plan Note (Addendum)
-  elevated BP today -likely due to white coat syndrome -recommended monitoring BP daily and bringing ambulatory readings at her next appointment -f/u in 1 monthn -reviewed Dash's eating plan and limiting salt intake

## 2022-03-20 NOTE — Assessment & Plan Note (Signed)
-  was following up with Dr. Luna Glasgow -x-ray on 09/10/2016 shows severe degenerative joint disease of the right hip with cyst formation, deformity of the hip, and acetabulum with degenerative destruction of the joint and superior migration of the hip.No fracture was seen  -will place a referral to orthopedics and start pt on melixicam

## 2022-03-20 NOTE — Assessment & Plan Note (Signed)
-   follow up with behavioral health at [facility] -next appt is in July

## 2022-03-20 NOTE — Patient Instructions (Addendum)
I appreciate the opportunity to provide care to you today!    Follow up:  1 months  Labs: please stop by the lab today to get your blood drawn (CBC, CMP, TSH, Lipid profile, HgA1c, Vit D)  Screening: HIV and Hep C  -Please stop by [facility] to get your mammogram  -Please pick up your mediation at the pharmacy  -Please stop by your local pharmacy and get your Tdap and shingles vaccine  Referrals today- orthopedics   Please continue to a heart-healthy diet and increase your physical activities. Try to exercise for at least three times a week.      It was a pleasure to see you and I look forward to continuing to work together on your health and well-being. Please do not hesitate to call the office if you need care or have questions about your care.   Have a wonderful day and week. With Gratitude, Gilmore Laroche MSN, FNP-BC

## 2022-03-21 LAB — CBC WITH DIFFERENTIAL/PLATELET
Basophils Absolute: 0.1 10*3/uL (ref 0.0–0.2)
Basos: 1 %
EOS (ABSOLUTE): 0.1 10*3/uL (ref 0.0–0.4)
Eos: 1 %
Hematocrit: 42.3 % (ref 34.0–46.6)
Hemoglobin: 14.5 g/dL (ref 11.1–15.9)
Immature Grans (Abs): 0 10*3/uL (ref 0.0–0.1)
Immature Granulocytes: 0 %
Lymphocytes Absolute: 2.5 10*3/uL (ref 0.7–3.1)
Lymphs: 28 %
MCH: 30.7 pg (ref 26.6–33.0)
MCHC: 34.3 g/dL (ref 31.5–35.7)
MCV: 89 fL (ref 79–97)
Monocytes Absolute: 0.7 10*3/uL (ref 0.1–0.9)
Monocytes: 8 %
Neutrophils Absolute: 5.6 10*3/uL (ref 1.4–7.0)
Neutrophils: 62 %
Platelets: 241 10*3/uL (ref 150–450)
RBC: 4.73 x10E6/uL (ref 3.77–5.28)
RDW: 13 % (ref 11.7–15.4)
WBC: 8.9 10*3/uL (ref 3.4–10.8)

## 2022-03-21 LAB — CMP14+EGFR
ALT: 16 IU/L (ref 0–32)
AST: 18 IU/L (ref 0–40)
Albumin/Globulin Ratio: 1.8 (ref 1.2–2.2)
Albumin: 4.6 g/dL (ref 3.8–4.9)
Alkaline Phosphatase: 109 IU/L (ref 44–121)
BUN/Creatinine Ratio: 16 (ref 9–23)
BUN: 16 mg/dL (ref 6–24)
Bilirubin Total: 0.4 mg/dL (ref 0.0–1.2)
CO2: 23 mmol/L (ref 20–29)
Calcium: 9.6 mg/dL (ref 8.7–10.2)
Chloride: 98 mmol/L (ref 96–106)
Creatinine, Ser: 0.99 mg/dL (ref 0.57–1.00)
Globulin, Total: 2.5 g/dL (ref 1.5–4.5)
Glucose: 130 mg/dL — ABNORMAL HIGH (ref 70–99)
Potassium: 4.2 mmol/L (ref 3.5–5.2)
Sodium: 138 mmol/L (ref 134–144)
Total Protein: 7.1 g/dL (ref 6.0–8.5)
eGFR: 67 mL/min/{1.73_m2} (ref >59)

## 2022-03-21 LAB — LIPID PANEL
Chol/HDL Ratio: 2.8 ratio (ref 0.0–4.4)
Cholesterol, Total: 223 mg/dL — ABNORMAL HIGH (ref 100–199)
HDL: 81 mg/dL (ref >39)
LDL Chol Calc (NIH): 126 mg/dL — ABNORMAL HIGH (ref 0–99)
Triglycerides: 93 mg/dL (ref 0–149)
VLDL Cholesterol Cal: 16 mg/dL (ref 5–40)

## 2022-03-21 LAB — HEPATITIS C ANTIBODY: Hep C Virus Ab: NONREACTIVE

## 2022-03-21 LAB — HEMOGLOBIN A1C
Est. average glucose Bld gHb Est-mCnc: 131 mg/dL
Hgb A1c MFr Bld: 6.2 % — ABNORMAL HIGH (ref 4.8–5.6)

## 2022-03-21 LAB — HIV ANTIBODY (ROUTINE TESTING W REFLEX): HIV Screen 4th Generation wRfx: NONREACTIVE

## 2022-03-21 LAB — VITAMIN D 25 HYDROXY (VIT D DEFICIENCY, FRACTURES): Vit D, 25-Hydroxy: 8.8 ng/mL — ABNORMAL LOW (ref 30.0–100.0)

## 2022-03-21 LAB — TSH+FREE T4
Free T4: 0.63 ng/dL — ABNORMAL LOW (ref 0.82–1.77)
TSH: 47 u[IU]/mL — ABNORMAL HIGH (ref 0.450–4.500)

## 2022-03-28 ENCOUNTER — Other Ambulatory Visit: Payer: Self-pay | Admitting: Family Medicine

## 2022-03-28 DIAGNOSIS — E038 Other specified hypothyroidism: Secondary | ICD-10-CM

## 2022-03-28 MED ORDER — LEVOTHYROXINE SODIUM 125 MCG PO TABS: 125.0000 ug | ORAL_TABLET | Freq: Every day | ORAL | 3 refills | Status: DC

## 2022-03-28 MED ORDER — VITAMIN D (ERGOCALCIFEROL) 1.25 MG (50000 UNIT) PO CAPS: 50000.0000 [IU] | ORAL_CAPSULE | ORAL | 1 refills | Status: DC

## 2022-03-28 NOTE — Progress Notes (Signed)
The 10-year ASCVD risk score (Arnett DK, et al., 2019) is: 5.2%   Values used to calculate the score:     Age: 56 years     Sex: Female     Is Non-Hispanic African American: No     Diabetic: Yes     Tobacco smoker: No     Systolic Blood Pressure: 153 mmHg     Is BP treated: No     HDL Cholesterol: 81 mg/dL     Total Cholesterol: 223 mg/dL

## 2022-03-28 NOTE — Progress Notes (Signed)
  Please inform the patient to pick up her Synthroid at the pharmacy and return in a 1 month for labs to assess her thyroid levels. Please advise her to take Synthroid on an empty stomach at least 30-60 minutes before breakfast.  She has prediabetes, and her cholesterol is elevated. I recommend a diet rich in fruits and vegetables with low carbs, fat, and sugar.   She can also pick up her once-weekly Vit D supplement at the pharmacy.

## 2022-04-04 ENCOUNTER — Other Ambulatory Visit: Payer: Self-pay | Admitting: Family Medicine

## 2022-04-04 ENCOUNTER — Telehealth: Payer: Self-pay | Admitting: Family Medicine

## 2022-04-04 DIAGNOSIS — M1611 Unilateral primary osteoarthritis, right hip: Secondary | ICD-10-CM

## 2022-04-04 DIAGNOSIS — F1990 Other psychoactive substance use, unspecified, uncomplicated: Secondary | ICD-10-CM

## 2022-04-04 MED ORDER — OMEPRAZOLE 20 MG PO CPDR: 20.0000 mg | DELAYED_RELEASE_CAPSULE | Freq: Every day | ORAL | 3 refills | Status: DC

## 2022-04-04 MED ORDER — MELOXICAM 15 MG PO TABS: 15.0000 mg | ORAL_TABLET | Freq: Every day | ORAL | 0 refills | Status: DC

## 2022-04-04 NOTE — Telephone Encounter (Signed)
Please inform the patient that I've increased the dose of meloxicam to 15 mg. I've also ordered omeprazole to prevent GI bleeding due to chronic NSAID use. Please advise her not to take [facility] powder, Ibuprofen, and meloxicam together because they increase the risk of GI bleed. A referral was placed to orthopedics surgery, and they should be contacting her for further evaluation.

## 2022-04-04 NOTE — Telephone Encounter (Signed)
Pt called stating the meloxicam is not helping &she still having to take Ibuprofen & BC powders to ease the pain. She is wanting to know if something can be called in for pain?   [facility] [employer]

## 2022-04-04 NOTE — Telephone Encounter (Signed)
Pt has been informed states understanding.  

## 2022-04-24 ENCOUNTER — Ambulatory Visit: Payer: Medicare Other | Admitting: Family Medicine

## 2022-04-27 ENCOUNTER — Encounter: Payer: Self-pay | Admitting: Family Medicine

## 2022-04-27 ENCOUNTER — Ambulatory Visit: Payer: Medicare Other | Admitting: Family Medicine

## 2022-04-28 ENCOUNTER — Other Ambulatory Visit: Payer: Self-pay | Admitting: Family Medicine

## 2022-04-28 DIAGNOSIS — M1611 Unilateral primary osteoarthritis, right hip: Secondary | ICD-10-CM

## 2022-05-01 ENCOUNTER — Ambulatory Visit: Payer: Medicare Other | Admitting: Orthopaedic Surgery

## 2022-05-04 ENCOUNTER — Other Ambulatory Visit: Payer: Self-pay | Admitting: Family Medicine

## 2022-05-04 DIAGNOSIS — M1611 Unilateral primary osteoarthritis, right hip: Secondary | ICD-10-CM

## 2022-05-15 ENCOUNTER — Encounter: Payer: Self-pay | Admitting: Family Medicine

## 2022-05-15 ENCOUNTER — Ambulatory Visit (INDEPENDENT_AMBULATORY_CARE_PROVIDER_SITE_OTHER): Payer: Medicare Other | Admitting: Family Medicine

## 2022-05-15 VITALS — BP 142/84 | HR 114 | Ht 60.0 in | Wt 333.0 lb

## 2022-05-15 DIAGNOSIS — I10 Essential (primary) hypertension: Secondary | ICD-10-CM

## 2022-05-15 MED ORDER — TELMISARTAN 20 MG PO TABS: 20.0000 mg | ORAL_TABLET | Freq: Every day | ORAL | 1 refills | Status: DC

## 2022-05-15 NOTE — Assessment & Plan Note (Signed)
Uncontrolled Report elevated ambulatory BP in the 140s and 150s systolic and 80s diastolic She notes consuming a high-sodium diet with minimum physical activities No headaches, dizziness, and blurred vision were noted today Will start pt on telmisartan 20 mg and f/u in 1 month Dash diet reviewed Encouraged low sodium diet and increasing physical activities

## 2022-05-15 NOTE — Patient Instructions (Signed)
I appreciate the opportunity to provide care to you today!    Follow up:  1 months   Telmisartan -Take each dose at the same time each day, with or without food -Avoid use of over-the-counter NSAIDs (e.g. ibuprofen, naproxen) because NSAIDs may result in increased risk of renal impairment (including acute renal failure) and decreased antihypertensive effect -in the first few days of therapy you may experience dizziness/light-headedness from the body adjusting to a lower blood pressure.     Please continue to a heart-healthy diet and increase your physical activities. Try to exercise for at least three times a week.      It was a pleasure to see you and I look forward to continuing to work together on your health and well-being. Please do not hesitate to call the office if you need care or have questions about your care.   Have a wonderful day and week. With Gratitude, Gilmore Laroche MSN, FNP-BC

## 2022-05-15 NOTE — Progress Notes (Signed)
Established Patient Office Visit  Subjective:  Patient ID: Amber Acevedo, female    DOB: 22-Aug-1966  Age: 56 y.o. MRN: [id number]  CC:  Chief Complaint  Patient presents with   Follow-up    Pt following up, had been checking bp at home, forgot the paper with readings. States she did have an incident when bp was high and felt dizzy.     HPI Amber Acevedo is a 56 y.o. female with past medical history of HTN presents for f/u of  chronic medical conditions.  HTN: Uncontrolled. Report elevated ambulatory BP in the 062I and 948N systolic and 46E diastolic. She notes consuming a high-sodium diet with minimum physical activities. No headaches, dizziness, and blurred vision were noted today.   Past Medical History:  Diagnosis Date   Arthritis    Diabetes mellitus without complication ([place])    Hypertension    Thyroid disease     Past Surgical History:  Procedure Laterality Date   APPENDECTOMY     CESAREAN SECTION     KNEE SURGERY      History reviewed. No pertinent family history.  Social History   Socioeconomic History   Marital status: Married    Spouse name: Not on file   Number of children: Not on file   Years of education: Not on file   Highest education level: Not on file  Occupational History   Not on file  Tobacco Use   Smoking status: Former    Packs/day: 1.00    Types: Cigarettes   Smokeless tobacco: Never  Substance and Sexual Activity   Alcohol use: No   Drug use: No   Sexual activity: Not on file  Other Topics Concern   Not on file  Social History Narrative   Not on file   Social Determinants of Health   Financial Resource Strain: Not on file  Food Insecurity: Not on file  Transportation Needs: Not on file  Physical Activity: Not on file  Stress: Not on file  Social Connections: Not on file  Intimate Partner Violence: Not on file    Outpatient Medications Prior to Visit  Medication Sig Dispense Refill   benztropine (COGENTIN) 1 MG tablet  1 tablet at bedtime.  0   citalopram (CELEXA) 40 MG tablet Take 40 mg by mouth daily.     levothyroxine (SYNTHROID) 125 MCG tablet Take 1 tablet (125 mcg total) by mouth daily. 90 tablet 3   meloxicam (MOBIC) 15 MG tablet TAKE 1 TABLET(15 MG) BY MOUTH DAILY 30 tablet 0   methocarbamol (ROBAXIN) 500 MG tablet Take 1 tablet (500 mg total) by mouth 3 (three) times daily. 21 tablet 0   risperiDONE (RISPERDAL) 2 MG tablet Take 2 mg by mouth at bedtime.     citalopram (CELEXA) 20 MG tablet 1 tablet daily. (Patient not taking: Reported on 05/15/2022)  0   citalopram (CELEXA) 40 MG tablet Take by mouth. (Patient not taking: Reported on 05/15/2022)     omeprazole (PRILOSEC) 20 MG capsule Take 1 capsule (20 mg total) by mouth daily. (Patient not taking: Reported on 05/15/2022) 30 capsule 3   Vitamin D, Ergocalciferol, (DRISDOL) 1.25 MG (50000 UNIT) CAPS capsule Take 1 capsule (50,000 Units total) by mouth every 7 (seven) days. (Patient not taking: Reported on 05/15/2022) 5 capsule 1   No facility-administered medications prior to visit.    Allergies  Allergen Reactions   Amoxicillin     ROS Review of Systems  Constitutional:  Negative for  diaphoresis, fatigue and fever.  Eyes:  Negative for visual disturbance.  Respiratory:  Negative for chest tightness and shortness of breath.   Neurological:  Negative for dizziness, weakness and headaches.  Psychiatric/Behavioral:  Negative for self-injury and suicidal ideas.       Objective:    Physical Exam HENT:     Head: Normocephalic.  Cardiovascular:     Rate and Rhythm: Normal rate and regular rhythm.     Pulses: Normal pulses.     Heart sounds: Normal heart sounds.  Pulmonary:     Effort: Pulmonary effort is normal.     Breath sounds: Normal breath sounds.  Neurological:     Mental Status: She is alert.     BP (!) 142/84 (BP Location: Left Arm)   Pulse (!) 114   Ht 5' (1.524 m)   Wt (!) 333 lb 0.6 oz (151.1 kg)   SpO2 90%   BMI 65.04  kg/m  Wt Readings from Last 3 Encounters:  05/15/22 (!) 333 lb 0.6 oz (151.1 kg)  03/20/22 (!) 332 lb (150.6 kg)  11/07/16 209 lb (94.8 kg)    Lab Results  Component Value Date   TSH 47.000 (H) 03/20/2022   Lab Results  Component Value Date   WBC 8.9 03/20/2022   HGB 14.5 03/20/2022   HCT 42.3 03/20/2022   MCV 89 03/20/2022   PLT 241 03/20/2022   Lab Results  Component Value Date   NA 138 03/20/2022   K 4.2 03/20/2022   CO2 23 03/20/2022   GLUCOSE 130 (H) 03/20/2022   BUN 16 03/20/2022   CREATININE 0.99 03/20/2022   BILITOT 0.4 03/20/2022   ALKPHOS 109 03/20/2022   AST 18 03/20/2022   ALT 16 03/20/2022   PROT 7.1 03/20/2022   ALBUMIN 4.6 03/20/2022   CALCIUM 9.6 03/20/2022   ANIONGAP 10 06/02/2015   EGFR 67 03/20/2022   Lab Results  Component Value Date   CHOL 223 (H) 03/20/2022   Lab Results  Component Value Date   HDL 81 03/20/2022   Lab Results  Component Value Date   LDLCALC 126 (H) 03/20/2022   Lab Results  Component Value Date   TRIG 93 03/20/2022   Lab Results  Component Value Date   CHOLHDL 2.8 03/20/2022   Lab Results  Component Value Date   HGBA1C 6.2 (H) 03/20/2022      Assessment & Plan:   Problem List Items Addressed This Visit       Cardiovascular and Mediastinum   HTN (hypertension) - Primary    Uncontrolled Report elevated ambulatory BP in the 979G and 921J systolic and 94R diastolic She notes consuming a high-sodium diet with minimum physical activities No headaches, dizziness, and blurred vision were noted today Will start pt on telmisartan 20 mg and f/u in 1 month Dash diet reviewed Encouraged low sodium diet and increasing physical activities       Relevant Medications   telmisartan (MICARDIS) 20 MG tablet   Other Relevant Orders   Microalbumin / creatinine urine ratio    Meds ordered this encounter  Medications   telmisartan (MICARDIS) 20 MG tablet    Sig: Take 1 tablet (20 mg total) by mouth daily.     Dispense:  90 tablet    Refill:  1    Follow-up: Return in about 1 month (around 06/15/2022) for BP.    Alvira Monday, FNP

## 2022-05-17 ENCOUNTER — Other Ambulatory Visit: Payer: Self-pay | Admitting: Family Medicine

## 2022-05-17 DIAGNOSIS — M1611 Unilateral primary osteoarthritis, right hip: Secondary | ICD-10-CM

## 2022-05-18 ENCOUNTER — Telehealth: Payer: Self-pay

## 2022-05-18 NOTE — Telephone Encounter (Signed)
Patient called need med refill asking to up the mg.  Meloxicam (MOBIC) 15 MG tablet  Pharmacy: [id number]

## 2022-05-19 ENCOUNTER — Other Ambulatory Visit: Payer: Self-pay

## 2022-05-19 DIAGNOSIS — M1611 Unilateral primary osteoarthritis, right hip: Secondary | ICD-10-CM

## 2022-05-19 MED ORDER — MELOXICAM 15 MG PO TABS: ORAL_TABLET | ORAL | 0 refills | Status: DC

## 2022-05-19 NOTE — Telephone Encounter (Signed)
Rx sent 

## 2022-05-29 ENCOUNTER — Ambulatory Visit (INDEPENDENT_AMBULATORY_CARE_PROVIDER_SITE_OTHER): Payer: Medicare Other

## 2022-05-29 DIAGNOSIS — Z0000 Encounter for general adult medical examination without abnormal findings: Secondary | ICD-10-CM

## 2022-05-29 NOTE — Progress Notes (Signed)
Subjective:   Amber Acevedo is a 56 y.o. female who presents for Medicare Annual (Subsequent) preventive examination.  Review of Systems    I connected with  Amber Acevedo on 05/29/22 by a audio enabled telemedicine application and verified that I am speaking with the correct person using two identifiers.  Patient Location: Home  Provider Location: Office/Clinic  I discussed the limitations of evaluation and management by telemedicine. The patient expressed understanding and agreed to proceed.        Objective:    There were no vitals filed for this visit. There is no height or weight on file to calculate BMI.     09/10/2016   11:49 AM 06/02/2015    5:00 PM 03/17/2015    7:03 PM  Advanced Directives  Does Patient Have a Medical Advance Directive? No    Would patient like information on creating a medical advance directive? No - Patient declined       Information is confidential and restricted. Go to Review Flowsheets to unlock data.     Current Medications (verified) Outpatient Encounter Medications as of 05/29/2022  Medication Sig   benztropine (COGENTIN) 1 MG tablet 1 tablet at bedtime.   citalopram (CELEXA) 20 MG tablet 1 tablet daily. (Patient not taking: Reported on 05/15/2022)   citalopram (CELEXA) 40 MG tablet Take 40 mg by mouth daily.   citalopram (CELEXA) 40 MG tablet Take by mouth. (Patient not taking: Reported on 05/15/2022)   levothyroxine (SYNTHROID) 125 MCG tablet Take 1 tablet (125 mcg total) by mouth daily.   meloxicam (MOBIC) 15 MG tablet TAKE 1 TABLET(15 MG) BY MOUTH DAILY   methocarbamol (ROBAXIN) 500 MG tablet Take 1 tablet (500 mg total) by mouth 3 (three) times daily.   omeprazole (PRILOSEC) 20 MG capsule Take 1 capsule (20 mg total) by mouth daily. (Patient not taking: Reported on 05/15/2022)   risperiDONE (RISPERDAL) 2 MG tablet Take 2 mg by mouth at bedtime.   telmisartan (MICARDIS) 20 MG tablet Take 1 tablet (20 mg total) by mouth daily.    Vitamin D, Ergocalciferol, (DRISDOL) 1.25 MG (50000 UNIT) CAPS capsule Take 1 capsule (50,000 Units total) by mouth every 7 (seven) days. (Patient not taking: Reported on 05/15/2022)   No facility-administered encounter medications on file as of 05/29/2022.    Allergies (verified) Amoxicillin   History: Past Medical History:  Diagnosis Date   Arthritis    Diabetes mellitus without complication ([facility])    Hypertension    Thyroid disease    Past Surgical History:  Procedure Laterality Date   APPENDECTOMY     CESAREAN SECTION     KNEE SURGERY     No family history on file. Social History   Socioeconomic History   Marital status: Married    Spouse name: Not on file   Number of children: Not on file   Years of education: Not on file   Highest education level: Not on file  Occupational History   Not on file  Tobacco Use   Smoking status: Former    Packs/day: 1.00    Types: Cigarettes   Smokeless tobacco: Never  Substance and Sexual Activity   Alcohol use: No   Drug use: No   Sexual activity: Not on file  Other Topics Concern   Not on file  Social History Narrative   Not on file   Social Determinants of Health   Financial Resource Strain: Not on file  Food Insecurity: Not on file  Transportation  Needs: Not on file  Physical Activity: Not on file  Stress: Not on file  Social Connections: Not on file    Tobacco Counseling Counseling given: Not Answered   Clinical Intake:   Diabetic?NO   Activities of Daily Living     No data to display           Patient Care Team: Gilmore Laroche, FNP as PCP - General (Family Medicine)  Indicate any recent Medical Services you may have received from other than [facility] providers in the past year (date may be approximate).     Assessment:   This is a routine wellness examination for Amber Acevedo.  Hearing/Vision screen No results found.  Dietary issues and exercise activities discussed:     Goals Addressed   None    Depression Screen    05/15/2022    1:57 PM 03/20/2022    3:05 PM  PHQ 2/9 Scores  PHQ - 2 Score 1 4  PHQ- 9 Score  14    Fall Risk    05/15/2022    1:57 PM 03/20/2022    3:05 PM  Fall Risk   Falls in the past year? 0 0  Number falls in past yr: 0 0  Injury with Fall? 0 0  Risk for fall due to : No Fall Risks No Fall Risks  Follow up Falls evaluation completed Falls evaluation completed    FALL RISK PREVENTION PERTAINING TO THE HOME:  Any stairs in or around the home? Yes  If so, are there any without handrails? Yes  Home free of loose throw rugs in walkways, pet beds, electrical cords, etc? Yes  Adequate lighting in your home to reduce risk of falls? Yes   ASSISTIVE DEVICES UTILIZED TO PREVENT FALLS:  Life alert? No  Use of a cane, walker or w/c? Yes  Grab bars in the bathroom? Yes  Shower chair or bench in shower?no Elevated toilet seat or a handicapped toilet? No   Immunizations There is no immunization history for the selected administration types on file for this patient.  TDAP status: Due, Education has been provided regarding the importance of this vaccine. Advised may receive this vaccine at local pharmacy or Health Dept. Aware to provide a copy of the vaccination record if obtained from local pharmacy or Health Dept. Verbalized acceptance and understanding.  Flu Vaccine status: Due, Education has been provided regarding the importance of this vaccine. Advised may receive this vaccine at local pharmacy or Health Dept. Aware to provide a copy of the vaccination record if obtained from local pharmacy or Health Dept. Verbalized acceptance and understanding.   Covid-19 vaccine status: Declined, Education has been provided regarding the importance of this vaccine but patient still declined. Advised may receive this vaccine at local pharmacy or Health Dept.or vaccine clinic. Aware to provide a copy of the vaccination record if obtained from local pharmacy or Health Dept.  Verbalized acceptance and understanding.  Qualifies for Shingles Vaccine? Yes   Zostavax completed No   Shingrix Completed?: No.    Education has been provided regarding the importance of this vaccine. Patient has been advised to call insurance company to determine out of pocket expense if they have not yet received this vaccine. Advised may also receive vaccine at local pharmacy or Health Dept. Verbalized acceptance and understanding.  Screening Tests Health Maintenance  Topic Date Due   COVID-19 Vaccine (1) Never done   OPHTHALMOLOGY EXAM  Never done   TETANUS/TDAP  Never done   PAP  SMEAR-Modifier  Never done   COLONOSCOPY (Pts 45-64yrs Insurance coverage will need to be confirmed)  Never done   MAMMOGRAM  Never done   Zoster Vaccines- Shingrix (1 of 2) Never done   INFLUENZA VACCINE  05/16/2022   HEMOGLOBIN A1C  09/19/2022   FOOT EXAM  03/21/2023   Hepatitis C Screening  Completed   HIV Screening  Completed   HPV VACCINES  Aged Out    Health Maintenance  Health Maintenance Due  Topic Date Due   COVID-19 Vaccine (1) Never done   OPHTHALMOLOGY EXAM  Never done   TETANUS/TDAP  Never done   PAP SMEAR-Modifier  Never done   COLONOSCOPY (Pts 45-57yrs Insurance coverage will need to be confirmed)  Never done   MAMMOGRAM  Never done   Zoster Vaccines- Shingrix (1 of 2) Never done   INFLUENZA VACCINE  05/16/2022    Colorectal cancer screening: Referral to GI placed  . Pt aware the office will call re: appt.  Mammogram status: Ordered  . Pt provided with contact info and advised to call to schedule appt. Scheduled for 06/08/22  Lung Cancer Screening: (Low Dose CT Chest recommended if Age 66-80 years, 30 pack-year currently smoking OR have quit w/in 15years.) does not qualify.   Lung Cancer Screening Referral: no  Additional Screening:  Hepatitis C Screening: does qualify; Completed 03/20/22  Vision Screening: Recommended annual ophthalmology exams for early detection of  glaucoma and other disorders of the eye. Is the patient up to date with their annual eye exam? no Who is the provider or what is the name of the office in which the patient attends annual eye exams? /a If pt is not established with a provider, would they like to be referred to a provider to establish care? No .   Dental Screening: Recommended annual dental exams for proper oral hygiene  Community Resource Referral / Chronic Care Management: CRR required this visit?  No   CCM required this visit?  No      Plan:     I have personally reviewed and noted the following in the patient's chart:   Medical and social history Use of alcohol, tobacco or illicit drugs  Current medications and supplements including opioid prescriptions.  Functional ability and status Nutritional status Physical activity Advanced directives List of other physicians Hospitalizations, surgeries, and ER visits in previous 12 months Vitals Screenings to include cognitive, depression, and falls Referrals and appointments  In addition, I have reviewed and discussed with patient certain preventive protocols, quality metrics, and best practice recommendations. A written personalized care plan for preventive services as well as general preventive health recommendations were provided to patient.     Jasper Riling, CMA   05/29/2022

## 2022-05-29 NOTE — Telephone Encounter (Signed)
You may refill meds

## 2022-05-29 NOTE — Patient Instructions (Signed)
  Ms. Kalman , Thank you for taking time to come for your Medicare Wellness Visit. I appreciate your ongoing commitment to your health goals. Please review the following plan we discussed and let me know if I can assist you in the future.   These are the goals we discussed:  Goals      Patient Stated     Find a new handicap accommodating apartment.        This is a list of the screening recommended for you and due dates:  Health Maintenance  Topic Date Due   COVID-19 Vaccine (1) Never done   Eye exam for diabetics  Never done   Tetanus Vaccine  Never done   Pap Smear  Never done   Colon Cancer Screening  Never done   Mammogram  Never done   Zoster (Shingles) Vaccine (1 of 2) Never done   Flu Shot  05/16/2022   Hemoglobin A1C  09/19/2022   Complete foot exam   03/21/2023   Hepatitis C Screening: USPSTF Recommendation to screen - Ages 18-79 yo.  Completed   HIV Screening  Completed   HPV Vaccine  Aged Out

## 2022-06-05 ENCOUNTER — Ambulatory Visit: Payer: Medicare Other | Admitting: Orthopaedic Surgery

## 2022-06-08 ENCOUNTER — Ambulatory Visit (HOSPITAL_COMMUNITY): Payer: Medicare Other

## 2022-06-13 ENCOUNTER — Ambulatory Visit (INDEPENDENT_AMBULATORY_CARE_PROVIDER_SITE_OTHER): Payer: Medicare Other | Admitting: Family Medicine

## 2022-06-13 ENCOUNTER — Encounter: Payer: Self-pay | Admitting: Family Medicine

## 2022-06-13 VITALS — BP 109/74 | HR 109 | Ht 60.0 in | Wt 329.0 lb

## 2022-06-13 DIAGNOSIS — E038 Other specified hypothyroidism: Secondary | ICD-10-CM

## 2022-06-13 DIAGNOSIS — F1990 Other psychoactive substance use, unspecified, uncomplicated: Secondary | ICD-10-CM

## 2022-06-13 DIAGNOSIS — I10 Essential (primary) hypertension: Secondary | ICD-10-CM | POA: Diagnosis not present

## 2022-06-13 DIAGNOSIS — E559 Vitamin D deficiency, unspecified: Secondary | ICD-10-CM

## 2022-06-13 DIAGNOSIS — M1611 Unilateral primary osteoarthritis, right hip: Secondary | ICD-10-CM | POA: Diagnosis not present

## 2022-06-13 MED ORDER — VITAMIN D (ERGOCALCIFEROL) 1.25 MG (50000 UNIT) PO CAPS: 50000.0000 [IU] | ORAL_CAPSULE | ORAL | 2 refills | Status: DC

## 2022-06-13 MED ORDER — OMEPRAZOLE 20 MG PO CPDR: 20.0000 mg | DELAYED_RELEASE_CAPSULE | Freq: Every day | ORAL | 3 refills | Status: DC

## 2022-06-13 MED ORDER — MELOXICAM 15 MG PO TABS: ORAL_TABLET | ORAL | 2 refills | Status: DC

## 2022-06-13 NOTE — Assessment & Plan Note (Signed)
Pending labs

## 2022-06-13 NOTE — Progress Notes (Signed)
Established Patient Office Visit  Subjective:  Patient ID: Amber Acevedo, female    DOB: 22-Mar-1966  Age: 56 y.o. MRN: [id number]  CC:  Chief Complaint  Patient presents with   Follow-up    1 month f/u, would like to discuss hip pain, meloxicam is not working to relieve her pain. Needs refills.     HPI Amber Acevedo is a 56 y.o. female with past medical history of HTN presents for f/u of  chronic medical conditions. HTN: Controlled. Reports adherence to Telmisartan 34m and low sodium diet. She denies headaches, dizziness, and blurred vision.  Past Medical History:  Diagnosis Date   Arthritis    [place]    Hypertension    Thyroid disease     Past Surgical History:  Procedure Laterality Date   APPENDECTOMY     CESAREAN SECTION     KNEE SURGERY      History reviewed. No pertinent family history.  Social History   Socioeconomic History   Marital status: Married    Spouse name: Not on file   Number of children: Not on file   Years of education: Not on file   Highest education level: Not on file  Occupational History   Not on file  Tobacco Use   Smoking status: Former    Packs/day: 1.00    Types: Cigarettes    Quit date: 04/06/2019    Years since quitting: 3.1   Smokeless tobacco: Never  Substance and Sexual Activity   Alcohol use: No   Drug use: No   Sexual activity: Not on file  Other Topics Concern   Not on file  Social History Narrative   Not on file   Social Determinants of Health   Financial Resource Strain: Low Risk  (05/29/2022)   Overall Financial Resource Strain (CARDIA)    Difficulty of Paying Living Expenses: Not hard at all  Food Insecurity: No Food Insecurity (05/29/2022)   Hunger Vital Sign    Worried About Running Out of Food in the Last Year: Never true    RCle Elumin the Last Year: Never true  Transportation Needs: No Transportation Needs (05/29/2022)   PRAPARE - TRadiographer, therapeutic(Medical): No    Lack of Transportation (Non-Medical): No  Physical Activity: Inactive (05/29/2022)   Exercise Vital Sign    Days of Exercise per Week: 0 days    Minutes of Exercise per Session: 0 min  Stress: No Stress Concern Present (05/29/2022)   [place]   Feeling of Stress : Only a little  Social Connections: Moderately Isolated (05/29/2022)   Social Connection and Isolation Panel [NHANES]    Frequency of Communication with Friends and Family: More than three times a week    Frequency of Social Gatherings with Friends and Family: More than three times a week    Attends Religious Services: Never    AMarine scientistor Organizations: No    Attends CArchivistMeetings: Never    Marital Status: Married  IHuman resources officerViolence: Not At Risk (05/29/2022)   Humiliation, Afraid, Rape, and Kick questionnaire    Fear of Current or Ex-Partner: No    Emotionally Abused: No    Physically Abused: No    Sexually Abused: No    Outpatient Medications Prior to Visit  Medication Sig Dispense Refill   benztropine (COGENTIN) 1 MG tablet 1  tablet at bedtime.  0   citalopram (CELEXA) 40 MG tablet Take 40 mg by mouth daily.     levothyroxine (SYNTHROID) 125 MCG tablet Take 1 tablet (125 mcg total) by mouth daily. 90 tablet 3   risperiDONE (RISPERDAL) 2 MG tablet Take 2 mg by mouth at bedtime.     telmisartan (MICARDIS) 20 MG tablet Take 1 tablet (20 mg total) by mouth daily. 90 tablet 1   citalopram (CELEXA) 40 MG tablet Take by mouth.     meloxicam (MOBIC) 15 MG tablet TAKE 1 TABLET(15 MG) BY MOUTH DAILY 30 tablet 0   methocarbamol (ROBAXIN) 500 MG tablet Take 1 tablet (500 mg total) by mouth 3 (three) times daily. 21 tablet 0   omeprazole (PRILOSEC) 20 MG capsule Take 1 capsule (20 mg total) by mouth daily. 30 capsule 3   Vitamin D, Ergocalciferol, (DRISDOL) 1.25 MG (50000 UNIT) CAPS capsule Take 1  capsule (50,000 Units total) by mouth every 7 (seven) days. 5 capsule 1   citalopram (CELEXA) 20 MG tablet 1 tablet daily. (Patient not taking: Reported on 06/13/2022)  0   No facility-administered medications prior to visit.    Allergies  Allergen Reactions   Amoxicillin     ROS Review of Systems  Constitutional:  Negative for fatigue and fever.  Gastrointestinal:  Negative for nausea and vomiting.  Genitourinary:  Negative for frequency and urgency.  Neurological:  Negative for dizziness and headaches.  Psychiatric/Behavioral:  Negative for self-injury and suicidal ideas.       Objective:    Physical Exam Cardiovascular:     Rate and Rhythm: Normal rate and regular rhythm.     Pulses: Normal pulses.     Heart sounds: Normal heart sounds.  Pulmonary:     Effort: Pulmonary effort is normal.     Breath sounds: Normal breath sounds.  Neurological:     Mental Status: She is alert.     BP 109/74   Pulse (!) 109   Ht 5' (1.524 m)   Wt (!) 329 lb (149.2 kg)   SpO2 92%   BMI 64.25 kg/m  Wt Readings from Last 3 Encounters:  06/13/22 (!) 329 lb (149.2 kg)  05/15/22 (!) 333 lb 0.6 oz (151.1 kg)  03/20/22 (!) 332 lb (150.6 kg)    Lab Results  Component Value Date   TSH 47.000 (H) 03/20/2022   Lab Results  Component Value Date   WBC 8.9 03/20/2022   HGB 14.5 03/20/2022   HCT 42.3 03/20/2022   MCV 89 03/20/2022   PLT 241 03/20/2022   Lab Results  Component Value Date   NA 138 03/20/2022   K 4.2 03/20/2022   CO2 23 03/20/2022   GLUCOSE 130 (H) 03/20/2022   BUN 16 03/20/2022   CREATININE 0.99 03/20/2022   BILITOT 0.4 03/20/2022   ALKPHOS 109 03/20/2022   AST 18 03/20/2022   ALT 16 03/20/2022   PROT 7.1 03/20/2022   ALBUMIN 4.6 03/20/2022   CALCIUM 9.6 03/20/2022   ANIONGAP 10 06/02/2015   EGFR 67 03/20/2022   Lab Results  Component Value Date   CHOL 223 (H) 03/20/2022   Lab Results  Component Value Date   HDL 81 03/20/2022   Lab Results   Component Value Date   LDLCALC 126 (H) 03/20/2022   Lab Results  Component Value Date   TRIG 93 03/20/2022   Lab Results  Component Value Date   CHOLHDL 2.8 03/20/2022   Lab Results  Component Value  Date   HGBA1C 6.2 (H) 03/20/2022      Assessment & Plan:   Problem List Items Addressed This Visit       Cardiovascular and Mediastinum   HTN (hypertension) - Primary    Controlled  Reports adherence to Telmisartan 35m and low sodium dietShe denies headaches, dizziness, and blurred vision No changes to medication regimen Continue Telmisartan 271m       Endocrine   Hypothyroidism    Pending labs      Relevant Orders   TSH + free T4     Musculoskeletal and Integument   Primary osteoarthritis of right hip    Refilled Meloxicam      Relevant Medications   meloxicam (MOBIC) 15 MG tablet   Other Visit Diagnoses     Vitamin D deficiency       Relevant Medications   Vitamin D, Ergocalciferol, (DRISDOL) 1.25 MG (50000 UNIT) CAPS capsule   Excessive use of nonsteroidal anti-inflammatory drug (NSAID)       Relevant Medications   omeprazole (PRILOSEC) 20 MG capsule       Meds ordered this encounter  Medications   meloxicam (MOBIC) 15 MG tablet    Sig: TAKE 1 TABLET(15 MG) BY MOUTH DAILY    Dispense:  30 tablet    Refill:  2   Vitamin D, Ergocalciferol, (DRISDOL) 1.25 MG (50000 UNIT) CAPS capsule    Sig: Take 1 capsule (50,000 Units total) by mouth every 7 (seven) days.    Dispense:  5 capsule    Refill:  2   omeprazole (PRILOSEC) 20 MG capsule    Sig: Take 1 capsule (20 mg total) by mouth daily.    Dispense:  30 capsule    Refill:  3    Follow-up: Return in about 3 months (around 09/13/2022).    GlAlvira MondayFNP

## 2022-06-13 NOTE — Assessment & Plan Note (Addendum)
Controlled  Reports adherence to Telmisartan 20mg  and low sodium dietShe denies headaches, dizziness, and blurred vision No changes to medication regimen Continue Telmisartan 20mg 

## 2022-06-13 NOTE — Assessment & Plan Note (Signed)
Refilled Meloxicam

## 2022-06-13 NOTE — Patient Instructions (Signed)
I appreciate the opportunity to provide care to you today!    Follow up:  3 months  Labs: please stop by the lab today to get your blood drawn (TSH)   Please pick up your medications at the pharmacy   Please continue to a heart-healthy diet and increase your physical activities. Try to exercise for 30mins at least three times a week.      It was a pleasure to see you and I look forward to continuing to work together on your health and well-being. Please do not hesitate to call the office if you need care or have questions about your care.   Have a wonderful day and week. With Gratitude, Ronin Crager MSN, FNP-BC  

## 2022-06-14 ENCOUNTER — Other Ambulatory Visit: Payer: Self-pay | Admitting: Family Medicine

## 2022-06-14 DIAGNOSIS — E038 Other specified hypothyroidism: Secondary | ICD-10-CM

## 2022-06-14 LAB — TSH+FREE T4
Free T4: 1.23 ng/dL (ref 0.82–1.77)
TSH: 19.1 u[IU]/mL — ABNORMAL HIGH (ref 0.450–4.500)

## 2022-06-14 NOTE — Progress Notes (Signed)
Please inform the patient to continue taking synthroid 125 mcg and return for labs in 6 weeks (07/26/22). The order is placed.

## 2022-06-27 ENCOUNTER — Ambulatory Visit: Payer: Medicare Other | Admitting: Orthopaedic Surgery

## 2022-08-08 ENCOUNTER — Ambulatory Visit: Payer: Medicare Other | Admitting: Orthopaedic Surgery

## 2022-09-13 ENCOUNTER — Ambulatory Visit: Payer: Medicare Other | Admitting: Family Medicine

## 2022-09-26 ENCOUNTER — Other Ambulatory Visit: Payer: Self-pay | Admitting: Family Medicine

## 2022-09-26 DIAGNOSIS — M1611 Unilateral primary osteoarthritis, right hip: Secondary | ICD-10-CM

## 2022-09-28 ENCOUNTER — Encounter: Payer: Self-pay | Admitting: Family Medicine

## 2022-10-19 ENCOUNTER — Encounter: Payer: Self-pay | Admitting: Family Medicine

## 2022-10-23 ENCOUNTER — Other Ambulatory Visit: Payer: Self-pay | Admitting: Family Medicine

## 2022-10-23 ENCOUNTER — Ambulatory Visit: Payer: Medicare Other | Admitting: Family Medicine

## 2022-10-25 ENCOUNTER — Other Ambulatory Visit: Payer: Self-pay | Admitting: Family Medicine

## 2022-10-25 DIAGNOSIS — M1611 Unilateral primary osteoarthritis, right hip: Secondary | ICD-10-CM

## 2022-10-25 DIAGNOSIS — F1990 Other psychoactive substance use, unspecified, uncomplicated: Secondary | ICD-10-CM

## 2022-10-29 ENCOUNTER — Other Ambulatory Visit: Payer: Self-pay | Admitting: Family Medicine

## 2022-11-10 ENCOUNTER — Other Ambulatory Visit: Payer: Self-pay | Admitting: Family Medicine

## 2022-11-27 ENCOUNTER — Other Ambulatory Visit: Payer: Self-pay | Admitting: Family Medicine

## 2022-11-27 ENCOUNTER — Ambulatory Visit: Payer: Medicare Other | Admitting: Family Medicine

## 2022-11-27 ENCOUNTER — Other Ambulatory Visit: Payer: Self-pay

## 2022-11-27 ENCOUNTER — Telehealth: Payer: Self-pay | Admitting: Family Medicine

## 2022-11-27 DIAGNOSIS — M1611 Unilateral primary osteoarthritis, right hip: Secondary | ICD-10-CM

## 2022-11-27 MED ORDER — DICLOFENAC SODIUM 1 % EX GEL: 2.0000 g | Freq: Four times a day (QID) | CUTANEOUS | 0 refills | Status: DC

## 2022-11-27 NOTE — Telephone Encounter (Signed)
Kindly inform the patient that a referral has been placed to pain management, and the prescription for Voltaren gel has been ordered.  Please encourage the patient to continue taking meloxicam 15 mg daily.

## 2022-11-27 NOTE — Telephone Encounter (Signed)
Pt called wanting to know if she can get something stronger for the pain in her hips and legs. She states she is taking OTC & it is not helping. Please contact patient for questions.

## 2022-11-27 NOTE — Telephone Encounter (Signed)
Pt informed

## 2022-11-27 NOTE — Telephone Encounter (Signed)
Referral was placed to orthopedics

## 2022-12-05 ENCOUNTER — Ambulatory Visit: Payer: Medicare Other | Admitting: Orthopaedic Surgery

## 2022-12-12 ENCOUNTER — Ambulatory Visit: Payer: Medicare Other | Admitting: Family Medicine

## 2022-12-12 ENCOUNTER — Ambulatory Visit: Payer: Medicare Other | Admitting: Orthopaedic Surgery

## 2022-12-14 ENCOUNTER — Encounter: Payer: Self-pay | Admitting: Radiology

## 2022-12-19 ENCOUNTER — Ambulatory Visit: Payer: Self-pay

## 2022-12-19 ENCOUNTER — Other Ambulatory Visit: Payer: Self-pay | Admitting: Orthopaedic Surgery

## 2022-12-19 ENCOUNTER — Encounter: Payer: Self-pay | Admitting: Orthopaedic Surgery

## 2022-12-19 ENCOUNTER — Ambulatory Visit (INDEPENDENT_AMBULATORY_CARE_PROVIDER_SITE_OTHER): Payer: Medicare Other | Admitting: Orthopaedic Surgery

## 2022-12-19 ENCOUNTER — Ambulatory Visit (INDEPENDENT_AMBULATORY_CARE_PROVIDER_SITE_OTHER): Payer: Medicare Other

## 2022-12-19 VITALS — BP 149/101 | HR 106 | Ht 60.0 in | Wt 338.0 lb

## 2022-12-19 DIAGNOSIS — Z6841 Body Mass Index (BMI) 40.0 and over, adult: Secondary | ICD-10-CM | POA: Diagnosis not present

## 2022-12-19 DIAGNOSIS — M1611 Unilateral primary osteoarthritis, right hip: Secondary | ICD-10-CM

## 2022-12-19 DIAGNOSIS — M1612 Unilateral primary osteoarthritis, left hip: Secondary | ICD-10-CM | POA: Diagnosis not present

## 2022-12-19 MED ORDER — HYDROCODONE-ACETAMINOPHEN 5-325 MG PO TABS: 1.0000 | ORAL_TABLET | ORAL | 0 refills | Status: AC | PRN

## 2022-12-19 NOTE — Patient Instructions (Signed)
 DR.KEELING'S SCHEDULE IS AS FOLLOWS: TUESDAY: ALL DAY WEDNESDAY: MORNING ONLY THURSDAY: MORNING ONLY PLEASE CALL OR SEND A MESSAGE VIA MYCHART BY WEDNESDAY MORNING SO THAT I CAN SEND A REQUEST TO HIM. HE LEAVES THURSDAY BY 11:30AM. HE DOES NOT RETURN TO THE OFFICE UNTIL TUESDAY MORNINGS AND HE DOES NOT CHECK HIS WORK MESSAGES DURING HIS TIME AWAY FROM THE OFFICE. I'M  AND IF YOU NEED SOMETHING, SEND ME A MYCHART MESSAGE OR CALL. MYCHART IS THE FASTEST WAY TO GET IN CONTACT WITH ME.   As the weather changes, you may notice you are affected more. You may have more pain in your joints. This is normal. Dress warmly and make sure that area is covered well.

## 2022-12-19 NOTE — Progress Notes (Signed)
Subjective:    Patient ID: Amber Acevedo, female    DOB: 1966-06-03, 57 y.o.   MRN: [id number]  HPI She has long history of right hip pain with severe degenerative changes of the right hip documented in 2017.  Now she has pain in both hips, more on the right.  She did not get anything done for the right hip as her husband had a debilitating stroke and she is the primary care provider.  She is using a wheelchair and walker.  She can hardly walk.  She is in pain. She has gained considerable weight over the last few years.  Now she wants something done for her hips as she cannot stand the pain.  She knows she will have to lose weight before any surgery can be considered.   Review of Systems  Constitutional:  Positive for activity change.  Musculoskeletal:  Positive for arthralgias, gait problem and myalgias.  All other systems reviewed and are negative. For Review of Systems, all other systems reviewed and are negative.  The following is a summary of the past history medically, past history surgically, known current medicines, social history and family history.  This information is gathered electronically by the computer from prior information and documentation.  I review this each visit and have found including this information at this point in the chart is beneficial and informative.   Past Medical History:  Diagnosis Date   Arthritis    Diabetes mellitus without complication ([place])    Hypertension    Thyroid disease     Past Surgical History:  Procedure Laterality Date   APPENDECTOMY     CESAREAN SECTION     KNEE SURGERY      Current Outpatient Medications on File Prior to Visit  Medication Sig Dispense Refill   benztropine (COGENTIN) 1 MG tablet 1 tablet at bedtime.  0   citalopram (CELEXA) 40 MG tablet Take 40 mg by mouth daily.     diclofenac Sodium (VOLTAREN) 1 % GEL Apply 2 g topically 4 (four) times daily. 2 g 0   levothyroxine (SYNTHROID) 125 MCG tablet Take 1 tablet  (125 mcg total) by mouth daily. 90 tablet 3   meloxicam (MOBIC) 15 MG tablet TAKE 1 TABLET(15 MG) BY MOUTH DAILY 30 tablet 2   omeprazole (PRILOSEC) 20 MG capsule TAKE 1 CAPSULE(20 MG) BY MOUTH DAILY 30 capsule 3   risperiDONE (RISPERDAL) 2 MG tablet Take 2 mg by mouth at bedtime.     telmisartan (MICARDIS) 20 MG tablet TAKE 1 TABLET(20 MG) BY MOUTH DAILY 90 tablet 1   Vitamin D, Ergocalciferol, (DRISDOL) 1.25 MG (50000 UNIT) CAPS capsule Take 1 capsule (50,000 Units total) by mouth every 7 (seven) days. 5 capsule 2   No current facility-administered medications on file prior to visit.    Social History   Socioeconomic History   Marital status: Married    Spouse name: Not on file   Number of children: Not on file   Years of education: Not on file   Highest education level: Not on file  Occupational History   Not on file  Tobacco Use   Smoking status: Former    Packs/day: 1.00    Types: Cigarettes    Quit date: 04/06/2019    Years since quitting: 3.7   Smokeless tobacco: Never  Substance and Sexual Activity   Alcohol use: No   Drug use: No   Sexual activity: Not on file  Other Topics Concern   Not  on file  Social History Narrative   Not on file   Social Determinants of Health   Financial Resource Strain: Low Risk  (05/29/2022)   Overall Financial Resource Strain (CARDIA)    Difficulty of Paying Living Expenses: Not hard at all  Food Insecurity: No Food Insecurity (05/29/2022)   Hunger Vital Sign    Worried About Running Out of Food in the Last Year: Never true    Ran Out of Food in the Last Year: Never true  Transportation Needs: No Transportation Needs (05/29/2022)   PRAPARE - Hydrologist (Medical): No    Lack of Transportation (Non-Medical): No  Physical Activity: Inactive (05/29/2022)   Exercise Vital Sign    Days of Exercise per Week: 0 days    Minutes of Exercise per Session: 0 min  Stress: No Stress Concern Present (05/29/2022)    [place]    Feeling of Stress : Only a little  Social Connections: Moderately Isolated (05/29/2022)   Social Connection and Isolation Panel [NHANES]    Frequency of Communication with Friends and Family: More than three times a week    Frequency of Social Gatherings with Friends and Family: More than three times a week    Attends Religious Services: Never    Marine scientist or Organizations: No    Attends Archivist Meetings: Never    Marital Status: Married  Human resources officer Violence: Not At Risk (05/29/2022)   Humiliation, Afraid, Rape, and Kick questionnaire    Fear of Current or Ex-Partner: No    Emotionally Abused: No    Physically Abused: No    Sexually Abused: No    History reviewed. No pertinent family history.  BP (!) 149/101   Pulse (!) 106   Ht 5' (1.524 m)   Wt (!) 338 lb (153.3 kg)   BMI 66.01 kg/m   Body mass index is 66.01 kg/m.      Objective:   Physical Exam Vitals and nursing note reviewed. Exam conducted with a chaperone present.  Constitutional:      Appearance: She is well-developed.  HENT:     Head: Normocephalic and atraumatic.  Eyes:     Conjunctiva/sclera: Conjunctivae normal.     Pupils: Pupils are equal, round, and reactive to light.  Cardiovascular:     Rate and Rhythm: Normal rate and regular rhythm.  Pulmonary:     Effort: Pulmonary effort is normal.  Abdominal:     Palpations: Abdomen is soft.  Musculoskeletal:     Cervical back: Normal range of motion and neck supple.       Legs:  Skin:    General: Skin is warm and dry.  Neurological:     Mental Status: She is alert and oriented to person, place, and time.     Cranial Nerves: No cranial nerve deficit.     Motor: No abnormal muscle tone.     Coordination: Coordination normal.     Deep Tendon Reflexes: Reflexes are normal and symmetric. Reflexes normal.  Psychiatric:        Behavior: Behavior  normal.        Thought Content: Thought content normal.        Judgment: Judgment normal.   X-rays were done of the hips, reported separately.        Assessment & Plan:   Encounter Diagnoses  Name Primary?   Primary osteoarthritis of right hip Yes  Primary osteoarthritis of left hip    Body mass index 60.0-69.9, adult (HCC)    Morbid obesity (HCC)    The X-rays were of poor quality and will need to be done at the hospital secondary to her body habitus.  I have explained this to her.  I have ordered the X-rays.  I have reviewed the [place] [facility] web site prior to prescribing narcotic medicine for this patient.  She needs to be on a significant weight reduction diet.  She will discuss with her primary care.  Return in one week.  Call if any problem.  Precautions discussed.  Electronically Signed Sanjuana Kava, MD 3/5/202410:59 AM

## 2022-12-21 ENCOUNTER — Ambulatory Visit: Payer: Medicare Other | Admitting: Family Medicine

## 2022-12-21 ENCOUNTER — Telehealth: Payer: Self-pay | Admitting: Family Medicine

## 2022-12-21 NOTE — Telephone Encounter (Signed)
Kindly proceed with the clinic policy, given the patient has been fully aware of the clinic's policy and subsequent actions.

## 2022-12-21 NOTE — Telephone Encounter (Signed)
Pt has missed 5 appts (02/13/22, 03/14/22, 04/24/22, 04/27/22 & 12/21/22). How would you like to proceed?

## 2022-12-25 ENCOUNTER — Encounter: Payer: Self-pay | Admitting: Family Medicine

## 2022-12-26 ENCOUNTER — Ambulatory Visit (HOSPITAL_COMMUNITY)
Admission: RE | Admit: 2022-12-26 | Discharge: 2022-12-26 | Disposition: A | Discharge status: Home / Self Care | Payer: Medicare Other | Source: Ambulatory Visit | Attending: Orthopaedic Surgery | Admitting: Orthopaedic Surgery

## 2022-12-26 ENCOUNTER — Ambulatory Visit: Payer: Medicare Other | Admitting: Orthopaedic Surgery

## 2022-12-26 ENCOUNTER — Telehealth: Payer: Self-pay

## 2022-12-26 DIAGNOSIS — M1612 Unilateral primary osteoarthritis, left hip: Secondary | ICD-10-CM | POA: Diagnosis present

## 2022-12-26 DIAGNOSIS — M1611 Unilateral primary osteoarthritis, right hip: Secondary | ICD-10-CM | POA: Diagnosis present

## 2022-12-26 MED ORDER — HYDROCODONE-ACETAMINOPHEN 5-325 MG PO TABS: ORAL_TABLET | ORAL | 0 refills | Status: DC

## 2022-12-26 NOTE — Telephone Encounter (Signed)
Hydrocodone-Acetaminophen 5/325 MG  Qty 30 Tablets   PATIENT USES [facility] ON [address]

## 2023-01-02 ENCOUNTER — Telehealth: Payer: Self-pay | Admitting: Orthopaedic Surgery

## 2023-01-02 ENCOUNTER — Ambulatory Visit: Payer: Medicare Other | Admitting: Orthopaedic Surgery

## 2023-01-02 NOTE — Telephone Encounter (Signed)
Dr. [name] pt - pt had an appointment today, but she's having transportation issues.  She rescheduled for 01/09/23.  She is requesting a refill on her Hydrocodone 5-325 to be sent to [facility] on [address]

## 2023-01-02 NOTE — Telephone Encounter (Signed)
Dr. [name] pt - pt had an appointment today, but she's having transportation issues.  She rescheduled for 01/09/23.  She is requesting a refill on her Hydrocodone

## 2023-01-09 ENCOUNTER — Telehealth: Payer: Self-pay | Admitting: Orthopaedic Surgery

## 2023-01-09 ENCOUNTER — Telehealth: Payer: Self-pay

## 2023-01-09 ENCOUNTER — Encounter: Payer: Self-pay | Admitting: Orthopaedic Surgery

## 2023-01-09 ENCOUNTER — Ambulatory Visit (INDEPENDENT_AMBULATORY_CARE_PROVIDER_SITE_OTHER): Payer: Medicare Other | Admitting: Orthopaedic Surgery

## 2023-01-09 VITALS — BP 142/88 | HR 94 | Ht 60.0 in | Wt 338.0 lb

## 2023-01-09 DIAGNOSIS — M1612 Unilateral primary osteoarthritis, left hip: Secondary | ICD-10-CM

## 2023-01-09 DIAGNOSIS — Z6841 Body Mass Index (BMI) 40.0 and over, adult: Secondary | ICD-10-CM | POA: Diagnosis not present

## 2023-01-09 DIAGNOSIS — M1611 Unilateral primary osteoarthritis, right hip: Secondary | ICD-10-CM

## 2023-01-09 MED ORDER — HYDROCODONE-ACETAMINOPHEN 5-325 MG PO TABS: ORAL_TABLET | ORAL | 0 refills | Status: DC

## 2023-01-09 NOTE — Progress Notes (Signed)
My hips still hurt.  She has significant DJD of both hips with marked changes.  She is very obese and confined to a wheelchair and not a surgical candidate until she can lose more weight.  Her BMI is 66.  I spent some time (30 minutes) going over the situation and she should try to get on a good diet and try to lose about 20 pounds by July 4.  She will contact her family doctor and try to get a diet or a referral to a nutritionist.  She wants to lose weight but she says she has not really put her mind to it.  She will this time.  Motion of the hips are painful and limited.  NV intact.  Encounter Diagnoses  Name Primary?   Primary osteoarthritis of right hip Yes   Primary osteoarthritis of left hip    Body mass index 60.0-69.9, adult ([place])    Morbid obesity ([place])    Return as needed.  Call if any problem.  Precautions discussed.  Electronically Signed Sanjuana Kava, MD 3/26/202410:05 PM

## 2023-01-09 NOTE — Telephone Encounter (Signed)
Dr. [name] - pt lvm stating she just left her appointment and forgot to ask for a refill on her Hydrocodone 5-325 to be sent to [facility] on [address].  She would like a call if it's sent in so she can arrange for someone to go pick it up for her.

## 2023-01-09 NOTE — Patient Instructions (Signed)
Talk to your primary care provider regarding weight loss medication such as the injections. Losing weight will help with your hip pain. You will not qualify for a hip replacement until you are able to lose weight. YOU GOT THIS!!!!!    DR.KEELING'S SCHEDULE IS AS FOLLOWS: TUESDAY: ALL DAY WEDNESDAY: MORNING ONLY THURSDAY: MORNING ONLY PLEASE CALL OR SEND A MESSAGE VIA MYCHART BY WEDNESDAY MORNING SO THAT I CAN SEND A REQUEST TO HIM. HE LEAVES THURSDAY BY 11:30AM. HE DOES NOT RETURN TO THE OFFICE UNTIL TUESDAY MORNINGS AND HE DOES NOT CHECK HIS WORK MESSAGES DURING HIS TIME AWAY FROM THE OFFICE.

## 2023-01-10 NOTE — Telephone Encounter (Signed)
Sent to provider 

## 2023-01-17 ENCOUNTER — Telehealth: Payer: Self-pay | Admitting: Orthopaedic Surgery

## 2023-01-17 MED ORDER — HYDROCODONE-ACETAMINOPHEN 5-325 MG PO TABS: ORAL_TABLET | ORAL | 0 refills | Status: DC

## 2023-01-17 NOTE — Telephone Encounter (Signed)
Patient lvm requesting a refill on Hydrocodone 5-325 to be sent to [facility] on [address]

## 2023-01-29 ENCOUNTER — Telehealth: Payer: Self-pay

## 2023-01-29 MED ORDER — HYDROCODONE-ACETAMINOPHEN 5-325 MG PO TABS: ORAL_TABLET | ORAL | 0 refills | Status: DC

## 2023-01-29 NOTE — Telephone Encounter (Signed)
Hydrocodone-Acetaminophen 5/325 MG  Qty 25 Tablets  PATIENT USES [facility] ON [address]

## 2023-02-05 ENCOUNTER — Telehealth: Payer: Self-pay | Admitting: Orthopaedic Surgery

## 2023-02-05 NOTE — Telephone Encounter (Signed)
Dr. Sanjuan Dame pt - pt lvm requesting a refill on Hydrocodone 5-325 to be sent to [facility] on [address]

## 2023-02-06 MED ORDER — HYDROCODONE-ACETAMINOPHEN 5-325 MG PO TABS: ORAL_TABLET | ORAL | 0 refills | Status: DC

## 2023-02-08 ENCOUNTER — Other Ambulatory Visit: Payer: Self-pay | Admitting: Family Medicine

## 2023-02-08 DIAGNOSIS — F1990 Other psychoactive substance use, unspecified, uncomplicated: Secondary | ICD-10-CM

## 2023-02-08 DIAGNOSIS — M1611 Unilateral primary osteoarthritis, right hip: Secondary | ICD-10-CM

## 2023-02-12 ENCOUNTER — Other Ambulatory Visit: Payer: Self-pay | Admitting: Orthopaedic Surgery

## 2023-02-12 MED ORDER — HYDROCODONE-ACETAMINOPHEN 5-325 MG PO TABS: ORAL_TABLET | ORAL | 0 refills | Status: DC

## 2023-02-12 NOTE — Telephone Encounter (Signed)
Dr. Sanjuan Dame pt - pt lvm requesting a refill on Hydrocodone 5-325, 25 quantity, as directed to be sent to [facility].

## 2023-02-12 NOTE — Telephone Encounter (Signed)
Send to [facility]

## 2023-02-19 ENCOUNTER — Telehealth: Payer: Self-pay | Admitting: Orthopaedic Surgery

## 2023-02-19 MED ORDER — HYDROCODONE-ACETAMINOPHEN 5-325 MG PO TABS: ORAL_TABLET | ORAL | 0 refills | Status: DC

## 2023-02-19 NOTE — Telephone Encounter (Signed)
Dr. Sanjuan Dame pt - pt lvm requesting a refill on Hyrdrocodone 5-325 to be sent to [facility] on [address].

## 2023-02-28 ENCOUNTER — Telehealth: Payer: Self-pay | Admitting: Orthopaedic Surgery

## 2023-02-28 NOTE — Telephone Encounter (Signed)
Dr. Sanjuan Dame pt - pt lvm requesting a refill for Hydrocodone to be sent to [facility] on [address]

## 2023-03-01 ENCOUNTER — Telehealth: Payer: Self-pay

## 2023-03-01 MED ORDER — HYDROCODONE-ACETAMINOPHEN 5-325 MG PO TABS: ORAL_TABLET | ORAL | 0 refills | Status: DC

## 2023-03-01 NOTE — Addendum Note (Signed)
Addended by: Earnstine Regal on: 03/01/2023 08:42 AM   Modules accepted: Orders

## 2023-03-10 ENCOUNTER — Other Ambulatory Visit: Payer: Self-pay | Admitting: Family Medicine

## 2023-03-10 DIAGNOSIS — E038 Other specified hypothyroidism: Secondary | ICD-10-CM

## 2023-03-13 ENCOUNTER — Telehealth: Payer: Self-pay | Admitting: Orthopaedic Surgery

## 2023-03-13 MED ORDER — HYDROCODONE-ACETAMINOPHEN 5-325 MG PO TABS: ORAL_TABLET | ORAL | 0 refills | Status: DC

## 2023-03-13 NOTE — Telephone Encounter (Signed)
Dr. Sanjuan Dame pt - pt lvm requesting a refill on Hydrocodone 5-325 to be sent to [facility] on [address]

## 2023-03-19 ENCOUNTER — Telehealth: Payer: Self-pay | Admitting: Orthopaedic Surgery

## 2023-03-19 NOTE — Telephone Encounter (Signed)
Dr. Sanjuan Dame pt - pt lvm requesting a refill on Hydrocodone 5-325 to be sent to [facility] [address].

## 2023-03-20 MED ORDER — HYDROCODONE-ACETAMINOPHEN 5-325 MG PO TABS: ORAL_TABLET | ORAL | 0 refills | Status: DC

## 2023-03-27 ENCOUNTER — Telehealth: Payer: Self-pay | Admitting: Orthopaedic Surgery

## 2023-03-27 MED ORDER — HYDROCODONE-ACETAMINOPHEN 5-325 MG PO TABS: ORAL_TABLET | ORAL | 0 refills | Status: DC

## 2023-03-27 NOTE — Telephone Encounter (Signed)
Dr. Sanjuan Dame pt - pt lvm requesting a refill on Hydrocodone 5-325 to be sent to [facility] [address]

## 2023-04-03 ENCOUNTER — Telehealth: Payer: Self-pay | Admitting: Orthopaedic Surgery

## 2023-04-03 MED ORDER — HYDROCODONE-ACETAMINOPHEN 5-325 MG PO TABS: ORAL_TABLET | ORAL | 0 refills | Status: DC

## 2023-04-03 NOTE — Telephone Encounter (Signed)
Dr. Keeling's pt - pt lvm requesting a refill on Hydrocodone 5-325 to be sent to [facility] on [address] 

## 2023-04-17 ENCOUNTER — Ambulatory Visit: Payer: Medicare Other | Admitting: Orthopaedic Surgery

## 2023-05-08 ENCOUNTER — Ambulatory Visit: Payer: Medicare Other | Admitting: Orthopaedic Surgery

## 2023-05-09 ENCOUNTER — Other Ambulatory Visit: Payer: Self-pay | Admitting: Family Medicine

## 2023-08-01 ENCOUNTER — Ambulatory Visit: Payer: Self-pay | Admitting: Family Medicine

## 2023-08-03 NOTE — Telephone Encounter (Signed)
error 

## 2023-11-14 ENCOUNTER — Ambulatory Visit: Payer: Medicaid Other | Admitting: Family Medicine

## 2023-11-20 ENCOUNTER — Encounter: Payer: Self-pay | Admitting: *Deleted

## 2023-11-26 ENCOUNTER — Emergency Department (HOSPITAL_COMMUNITY)
Admission: EM | Admit: 2023-11-26 | Discharge: 2023-11-26 | Discharge status: Against Medical Advice | Payer: 59 | Attending: Emergency Medicine | Admitting: Emergency Medicine

## 2023-11-26 ENCOUNTER — Other Ambulatory Visit: Payer: Self-pay

## 2023-11-26 ENCOUNTER — Emergency Department (HOSPITAL_COMMUNITY): Payer: 59

## 2023-11-26 ENCOUNTER — Encounter (HOSPITAL_COMMUNITY): Payer: Self-pay

## 2023-11-26 DIAGNOSIS — R079 Chest pain, unspecified: Secondary | ICD-10-CM | POA: Diagnosis not present

## 2023-11-26 DIAGNOSIS — Z5321 Procedure and treatment not carried out due to patient leaving prior to being seen by health care provider: Secondary | ICD-10-CM | POA: Insufficient documentation

## 2023-11-26 DIAGNOSIS — R002 Palpitations: Secondary | ICD-10-CM | POA: Diagnosis not present

## 2023-11-26 DIAGNOSIS — R030 Elevated blood-pressure reading, without diagnosis of hypertension: Secondary | ICD-10-CM | POA: Diagnosis not present

## 2023-11-26 DIAGNOSIS — R42 Dizziness and giddiness: Secondary | ICD-10-CM | POA: Diagnosis not present

## 2023-11-26 DIAGNOSIS — R202 Paresthesia of skin: Secondary | ICD-10-CM | POA: Diagnosis not present

## 2023-11-26 LAB — BASIC METABOLIC PANEL
Anion gap: 15 (ref 5–15)
BUN: 12 mg/dL (ref 6–20)
CO2: 23 mmol/L (ref 22–32)
Calcium: 9.5 mg/dL (ref 8.9–10.3)
Chloride: 101 mmol/L (ref 98–111)
Creatinine, Ser: 0.79 mg/dL (ref 0.44–1.00)
GFR, Estimated: >60 mL/min (ref >60)
Glucose, Bld: 138 mg/dL — ABNORMAL HIGH (ref 70–99)
Potassium: 3.5 mmol/L (ref 3.5–5.1)
Sodium: 139 mmol/L (ref 135–145)

## 2023-11-26 LAB — CBC WITH DIFFERENTIAL/PLATELET
Abs Immature Granulocytes: 0.03 10*3/uL (ref 0.00–0.07)
Basophils Absolute: 0.1 10*3/uL (ref 0.0–0.1)
Basophils Relative: 0 %
Eosinophils Absolute: 0.1 10*3/uL (ref 0.0–0.5)
Eosinophils Relative: 1 %
HCT: 44.8 % (ref 36.0–46.0)
Hemoglobin: 15 g/dL (ref 12.0–15.0)
Immature Granulocytes: 0 %
Lymphocytes Relative: 26 %
Lymphs Abs: 2.9 10*3/uL (ref 0.7–4.0)
MCH: 31 pg (ref 26.0–34.0)
MCHC: 33.5 g/dL (ref 30.0–36.0)
MCV: 92.6 fL (ref 80.0–100.0)
Monocytes Absolute: 0.8 10*3/uL (ref 0.1–1.0)
Monocytes Relative: 7 %
Neutro Abs: 7.4 10*3/uL (ref 1.7–7.7)
Neutrophils Relative %: 66 %
Platelets: 296 10*3/uL (ref 150–400)
RBC: 4.84 MIL/uL (ref 3.87–5.11)
RDW: 13.2 % (ref 11.5–15.5)
WBC: 11.4 10*3/uL — ABNORMAL HIGH (ref 4.0–10.5)
nRBC: 0 % (ref 0.0–0.2)

## 2023-11-26 LAB — TROPONIN I (HIGH SENSITIVITY): Troponin I (High Sensitivity): <2 ng/L (ref <18)

## 2023-11-26 NOTE — ED Triage Notes (Signed)
 Pt arrived via POV c/o dizziness and tingling sensations that began on Saturday. Pt went to [facility] and was told to come to the ER for further evaluation. Pt reports they were requesting Pt receive an EKG. Pt denies chest pain, denies SOB.

## 2023-11-27 DIAGNOSIS — R202 Paresthesia of skin: Secondary | ICD-10-CM | POA: Diagnosis not present

## 2023-11-27 DIAGNOSIS — R000 Tachycardia, unspecified: Secondary | ICD-10-CM | POA: Diagnosis not present

## 2023-11-27 DIAGNOSIS — R450 Nervousness: Secondary | ICD-10-CM | POA: Diagnosis not present

## 2024-01-08 ENCOUNTER — Ambulatory Visit (INDEPENDENT_AMBULATORY_CARE_PROVIDER_SITE_OTHER): Payer: Medicaid Other | Admitting: Family Medicine

## 2024-01-08 ENCOUNTER — Encounter: Payer: Self-pay | Admitting: Family Medicine

## 2024-01-08 DIAGNOSIS — R350 Frequency of micturition: Secondary | ICD-10-CM

## 2024-01-08 DIAGNOSIS — E559 Vitamin D deficiency, unspecified: Secondary | ICD-10-CM | POA: Insufficient documentation

## 2024-01-08 DIAGNOSIS — F333 Major depressive disorder, recurrent, severe with psychotic symptoms: Secondary | ICD-10-CM | POA: Diagnosis not present

## 2024-01-08 DIAGNOSIS — N3000 Acute cystitis without hematuria: Secondary | ICD-10-CM

## 2024-01-08 DIAGNOSIS — R8290 Unspecified abnormal findings in urine: Secondary | ICD-10-CM

## 2024-01-08 DIAGNOSIS — M160 Bilateral primary osteoarthritis of hip: Secondary | ICD-10-CM | POA: Insufficient documentation

## 2024-01-08 DIAGNOSIS — I10 Essential (primary) hypertension: Secondary | ICD-10-CM | POA: Diagnosis not present

## 2024-01-08 DIAGNOSIS — E039 Hypothyroidism, unspecified: Secondary | ICD-10-CM

## 2024-01-08 DIAGNOSIS — Z6841 Body Mass Index (BMI) 40.0 and over, adult: Secondary | ICD-10-CM

## 2024-01-08 DIAGNOSIS — R7309 Other abnormal glucose: Secondary | ICD-10-CM | POA: Diagnosis not present

## 2024-01-08 LAB — MICROSCOPIC EXAMINATION
RBC, Urine: NONE SEEN /HPF (ref 0–2)
Renal Epithel, UA: NONE SEEN /HPF
Yeast, UA: NONE SEEN

## 2024-01-08 LAB — URINALYSIS, ROUTINE W REFLEX MICROSCOPIC
Bilirubin, UA: NEGATIVE
Glucose, UA: NEGATIVE
Ketones, UA: NEGATIVE
Nitrite, UA: NEGATIVE
Protein,UA: NEGATIVE
Specific Gravity, UA: 1.01 (ref 1.005–1.030)
Urobilinogen, Ur: 0.2 mg/dL (ref 0.2–1.0)
pH, UA: 6 (ref 5.0–7.5)

## 2024-01-08 LAB — BAYER DCA HB A1C WAIVED: HB A1C (BAYER DCA - WAIVED): 6.4 % — ABNORMAL HIGH (ref 4.8–5.6)

## 2024-01-08 NOTE — Progress Notes (Signed)
 Subjective:  Patient ID: Amber Acevedo, female    DOB: [birth year], 58 y.o.   MRN: [id number]  Patient Care Team: Sonny Masters, FNP as PCP - General (Family Medicine)   Chief Complaint:  New Patient (Initial Visit) ([facility]/), Establish Care, and Urinary Frequency (States it has been going on a few years )   HPI: Amber Acevedo is a 58 y.o. female presenting on 01/08/2024 for New Patient (Initial Visit) ([facility]/), Establish Care, and Urinary Frequency (States it has been going on a few years )   Discussed the use of AI scribe software for clinical note transcription with the patient, who gave verbal consent to proceed.  History of Present Illness   Amber Acevedo is a 58 year old female who presents to establish care. She has previously seen an orthopedic specialist, Dr. Hilda Lias, for her hip issues.  She is in a wheelchair due to severe osteoarthritis in both hips, which necessitates bilateral hip replacements. She has consulted with an orthopedic specialist, Dr. Hilda Lias, regarding this issue.  She experiences urinary frequency, urgency, and changes in smell and color for at least two years. She has not undergone any testing or treatment for a urinary tract infection due to transportation challenges.  She has a history of hypothyroidism diagnosed in her early thirties. She has not taken thyroid medication for at least two years and experiences symptoms of fatigue and weight gain. Her last known thyroid medication was levothyroxine 125 mcg.  She has a history of depression and anxiety, managed by Dr. Geanie Cooley. She is compliant with her treatment for these conditions.  She has a history of prediabetes and was previously on metformin. She also has a history of vitamin D deficiency and is currently taking over-the-counter vitamin D supplements.  She quit smoking on April 06, 2019, after a 20-pack-year history, having started smoking in  her early twenties. Her surgical history includes an appendectomy, two C-sections, removal of a breast cyst, and knee surgery.          Relevant past medical, surgical, family, and social history reviewed and updated as indicated.  Allergies and medications reviewed and updated. Data reviewed: Chart in Epic.   Past Medical History:  Diagnosis Date   Arthritis    Diabetes mellitus without complication (HCC)    Hypertension    Thyroid disease     Past Surgical History:  Procedure Laterality Date   APPENDECTOMY     CESAREAN SECTION     KNEE SURGERY      Social History   Socioeconomic History   Marital status: Married    Spouse name: Not on file   Number of children: Not on file   Years of education: Not on file   Highest education level: Some college, no degree  Occupational History   Not on file  Tobacco Use   Smoking status: Former    Current packs/day: 0.00    Types: Cigarettes    Quit date: 04/06/2019    Years since quitting: 4.7   Smokeless tobacco: Never  Vaping Use   Vaping status: Never Used  Substance and Sexual Activity   Alcohol use: No   Drug use: No   Sexual activity: Not Currently  Other Topics Concern   Not on file  Social History Narrative   Not on file   Social Drivers of Health   Financial Resource Strain: Low Risk  (01/07/2024)   Overall Financial  Resource Strain (CARDIA)    Difficulty of Paying Living Expenses: Not very hard  Food Insecurity: No Food Insecurity (01/07/2024)   Hunger Vital Sign    Worried About Running Out of Food in the Last Year: Never true    Ran Out of Food in the Last Year: Never true  Transportation Needs: Unmet Transportation Needs (01/07/2024)   PRAPARE - Administrator, Civil Service (Medical): Yes    Lack of Transportation (Non-Medical): No  Physical Activity: Unknown (01/07/2024)   Exercise Vital Sign    Days of Exercise per Week: 0 days    Minutes of Exercise per Session: Not on file  Stress:  Stress Concern Present (01/07/2024)   [employer] of Occupational Health - Occupational Stress Questionnaire    Feeling of Stress : To some extent  Social Connections: Socially Isolated (01/07/2024)   Social Connection and Isolation Panel [NHANES]    Frequency of Communication with Friends and Family: More than three times a week    Frequency of Social Gatherings with Friends and Family: Once a week    Attends Religious Services: Never    Database administrator or Organizations: No    Attends Engineer, structural: Not on file    Marital Status: Widowed  Intimate Partner Violence: Not At Risk (05/29/2022)   Humiliation, Afraid, Rape, and Kick questionnaire    Fear of Current or Ex-Partner: No    Emotionally Abused: No    Physically Abused: No    Sexually Abused: No    Outpatient Encounter Medications as of 01/08/2024  Medication Sig   aspirin EC 81 MG tablet Take 81 mg by mouth daily. Swallow whole.   benztropine (COGENTIN) 1 MG tablet 1 tablet at bedtime.   Cholecalciferol (VITAMIN D-3) 125 MCG (5000 UT) TABS Take by mouth.   citalopram (CELEXA) 40 MG tablet Take 40 mg by mouth daily.   risperiDONE (RISPERDAL) 2 MG tablet Take 2 mg by mouth at bedtime.   [DISCONTINUED] diclofenac Sodium (VOLTAREN) 1 % GEL Apply 2 g topically 4 (four) times daily.   [DISCONTINUED] HYDROcodone-acetaminophen (NORCO/VICODIN) 5-325 MG tablet One tablet by mouth every six hours as needed for pain.  Seven day limit   [DISCONTINUED] levothyroxine (SYNTHROID) 125 MCG tablet Take 1 tablet (125 mcg total) by mouth daily.   [DISCONTINUED] meloxicam (MOBIC) 15 MG tablet TAKE 1 TABLET(15 MG) BY MOUTH DAILY   [DISCONTINUED] omeprazole (PRILOSEC) 20 MG capsule TAKE 1 CAPSULE(20 MG) BY MOUTH DAILY   [DISCONTINUED] telmisartan (MICARDIS) 20 MG tablet TAKE 1 TABLET(20 MG) BY MOUTH DAILY   [DISCONTINUED] Vitamin D, Ergocalciferol, (DRISDOL) 1.25 MG (50000 UNIT) CAPS capsule Take 1 capsule (50,000 Units total)  by mouth every 7 (seven) days.   No facility-administered encounter medications on file as of 01/08/2024.    Allergies  Allergen Reactions   Amoxicillin     Pertinent ROS per HPI, otherwise unremarkable      Objective:  BP 129/85   Pulse 93   Temp 98 F (36.7 C)   Ht 5' (1.524 m)   Wt (!) 310 lb (140.6 kg)   SpO2 94%   BMI 60.54 kg/m    Wt Readings from Last 3 Encounters:  01/08/24 (!) 310 lb (140.6 kg)  11/26/23 (!) 337 lb 15.4 oz (153.3 kg)  01/09/23 (!) 338 lb (153.3 kg)    Physical Exam Vitals and nursing note reviewed.  Constitutional:      General: She is not in acute distress.  Appearance: She is morbidly obese. She is ill-appearing (chronically ill). She is not toxic-appearing or diaphoretic.  HENT:     Head: Normocephalic and atraumatic.     Nose: Nose normal.     Mouth/Throat:     Mouth: Mucous membranes are moist.     Pharynx: Oropharynx is clear.  Eyes:     Conjunctiva/sclera: Conjunctivae normal.     Pupils: Pupils are equal, round, and reactive to light.  Neck:     Thyroid: No thyroid mass, thyromegaly or thyroid tenderness.     Trachea: Trachea and phonation normal.  Cardiovascular:     Rate and Rhythm: Normal rate and regular rhythm.     Heart sounds: Normal heart sounds.  Pulmonary:     Effort: Pulmonary effort is normal.     Breath sounds: Normal breath sounds.  Musculoskeletal:     Cervical back: Neck supple.     Right lower leg: No edema.     Left lower leg: No edema.  Skin:    General: Skin is warm and dry.     Capillary Refill: Capillary refill takes less than 2 seconds.  Neurological:     General: No focal deficit present.     Mental Status: She is alert and oriented to person, place, and time.     Gait: Gait abnormal (in wheelchair).  Psychiatric:        Attention and Perception: Attention and perception normal.        Mood and Affect: Mood is anxious.        Speech: Speech normal.        Behavior: Behavior normal. Behavior  is cooperative.        Thought Content: Thought content normal.        Cognition and Memory: Cognition and memory normal.       Results for orders placed or performed during the hospital encounter of 11/26/23  Basic metabolic panel   Collection Time: 11/26/23  5:52 PM  Result Value Ref Range   Sodium 139 135 - 145 mmol/L   Potassium 3.5 3.5 - 5.1 mmol/L   Chloride 101 98 - 111 mmol/L   CO2 23 22 - 32 mmol/L   Glucose, Bld 138 (H) 70 - 99 mg/dL   BUN 12 6 - 20 mg/dL   Creatinine, Ser 1.61 0.44 - 1.00 mg/dL   Calcium 9.5 8.9 - 09.6 mg/dL   GFR, Estimated >04 >54 mL/min   Anion gap 15 5 - 15  CBC with Differential   Collection Time: 11/26/23  5:52 PM  Result Value Ref Range   WBC 11.4 (H) 4.0 - 10.5 K/uL   RBC 4.84 3.87 - 5.11 MIL/uL   Hemoglobin 15.0 12.0 - 15.0 g/dL   HCT 09.8 11.9 - 14.7 %   MCV 92.6 80.0 - 100.0 fL   MCH 31.0 26.0 - 34.0 pg   MCHC 33.5 30.0 - 36.0 g/dL   RDW 82.9 56.2 - 13.0 %   Platelets 296 150 - 400 K/uL   nRBC 0.0 0.0 - 0.2 %   Neutrophils Relative % 66 %   Neutro Abs 7.4 1.7 - 7.7 K/uL   Lymphocytes Relative 26 %   Lymphs Abs 2.9 0.7 - 4.0 K/uL   Monocytes Relative 7 %   Monocytes Absolute 0.8 0.1 - 1.0 K/uL   Eosinophils Relative 1 %   Eosinophils Absolute 0.1 0.0 - 0.5 K/uL   Basophils Relative 0 %   Basophils Absolute 0.1 0.0 - 0.1  K/uL   Immature Granulocytes 0 %   Abs Immature Granulocytes 0.03 0.00 - 0.07 K/uL  Troponin I (High Sensitivity)   Collection Time: 11/26/23  5:52 PM  Result Value Ref Range   Troponin I (High Sensitivity) <2 <18 ng/L       Pertinent labs & imaging results that were available during my care of the patient were reviewed by me and considered in my medical decision making.  Assessment & Plan:  Molley was seen today for new patient (initial visit), establish care and urinary frequency.  Diagnoses and all orders for this visit:  Morbid obesity (HCC) -     Bayer DCA Hb A1c Waived -     CMP14+EGFR -      CBC with Differential/Platelet -     Lipid panel -     Thyroid Panel With TSH -     VITAMIN D 25 Hydroxy (Vit-D Deficiency, Fractures)  Major depressive disorder, recurrent, severe with psychotic symptoms (HCC) -     Thyroid Panel With TSH -     VITAMIN D 25 Hydroxy (Vit-D Deficiency, Fractures)  Acquired hypothyroidism -     Thyroid Panel With TSH  Vitamin D deficiency -     CMP14+EGFR -     VITAMIN D 25 Hydroxy (Vit-D Deficiency, Fractures)  Primary hypertension -     Bayer DCA Hb A1c Waived -     CMP14+EGFR -     CBC with Differential/Platelet -     Lipid panel -     Thyroid Panel With TSH  Primary osteoarthritis of both hips -     CMP14+EGFR -     VITAMIN D 25 Hydroxy (Vit-D Deficiency, Fractures)  Urinary frequency -     Urinalysis, Routine w reflex microscopic -     Urine Culture  Malodorous urine -     Urinalysis, Routine w reflex microscopic -     Urine Culture     Assessment and Plan    Osteoarthritis of both hips Chronic osteoarthritis in both hips necessitating wheelchair use. Previous orthopedic consultation without recent follow-up due to transportation issues. - Coordinate with orthopedic specialist for potential hip replacement evaluation.  Urinary symptoms Chronic urinary frequency, urgency, and changes in smell and color for at least two years without evaluation or treatment for urinary tract infection due to transportation challenges. - Order urinalysis to evaluate for urinary tract infection. - Treat urinary tract infection if indicated based on urinalysis results.  Hypothyroidism Long-standing hypothyroidism with significant symptoms including fatigue and weight gain. Previous TSH level was 19. Not on medication for at least two years; previously on 125 mcg of thyroid medication. - Order thyroid function tests to assess current thyroid status. - Restart thyroid medication based on lab results.  Prediabetes Prediabetes with recent blood sugar  level of 138 mg/dL. Previous treatment with metformin. Plan to assess current status with lab work and initiate treatment if diabetes is confirmed, considering kidney protection with lisinopril. - Order HbA1c to evaluate current glycemic control. - Consider restarting diabetes medication if HbA1c indicates diabetes.  Hypertension Blood pressure today is 129/85 mmHg, within normal range. No current antihypertensive medication. Plan to initiate treatment if diabetes is confirmed and requires medication. - Consider starting low-dose lisinopril if diabetes medication is initiated.  Depression and anxiety Depression and anxiety with ongoing care from Dr. Geanie Cooley. No current treatment plan discussed.  Vitamin D deficiency Vitamin D deficiency managed with over-the-counter supplements.  General Health Maintenance Overdue for colonoscopy and cervical  cancer screening. Last Pap smear was several years ago. - Schedule colonoscopy. - Schedule cervical cancer screening (Pap smear).  Follow-up Reassess condition and treatment efficacy after initiating new medications. Labs will be rechecked in three months if thyroid or diabetes medications are started. - Schedule follow-up appointment in three months for physical examination and lab review. - Recheck labs in three months if thyroid or diabetes medications are started.          Continue all other maintenance medications.  Follow up plan: Return in about 3 months (around 04/09/2024), or if symptoms worsen or fail to improve, for Annual Physical.   Continue healthy lifestyle choices, including diet (rich in fruits, vegetables, and lean proteins, and low in salt and simple carbohydrates) and exercise (at least 30 minutes of moderate physical activity daily).   The above assessment and management plan was discussed with the patient. The patient verbalized understanding of and has agreed to the management plan. Patient is aware to call the clinic if  they develop any new symptoms or if symptoms persist or worsen. Patient is aware when to return to the clinic for a follow-up visit. Patient educated on when it is appropriate to go to the emergency department.   Kari Baars, FNP-C [facility] [phone]

## 2024-01-09 ENCOUNTER — Encounter: Payer: Self-pay | Admitting: Family Medicine

## 2024-01-09 LAB — CMP14+EGFR
ALT: 15 IU/L (ref 0–32)
AST: 21 IU/L (ref 0–40)
Albumin: 4.3 g/dL (ref 3.8–4.9)
Alkaline Phosphatase: 98 IU/L (ref 44–121)
BUN/Creatinine Ratio: 13 (ref 9–23)
BUN: 12 mg/dL (ref 6–24)
Bilirubin Total: 0.3 mg/dL (ref 0.0–1.2)
CO2: 23 mmol/L (ref 20–29)
Calcium: 9.6 mg/dL (ref 8.7–10.2)
Chloride: 99 mmol/L (ref 96–106)
Creatinine, Ser: 0.92 mg/dL (ref 0.57–1.00)
Globulin, Total: 2.8 g/dL (ref 1.5–4.5)
Glucose: 134 mg/dL — ABNORMAL HIGH (ref 70–99)
Potassium: 4.2 mmol/L (ref 3.5–5.2)
Sodium: 140 mmol/L (ref 134–144)
Total Protein: 7.1 g/dL (ref 6.0–8.5)
eGFR: 72 mL/min/{1.73_m2} (ref >59)

## 2024-01-09 LAB — CBC WITH DIFFERENTIAL/PLATELET
Basophils Absolute: 0.1 10*3/uL (ref 0.0–0.2)
Basos: 1 %
EOS (ABSOLUTE): 0.1 10*3/uL (ref 0.0–0.4)
Eos: 1 %
Hematocrit: 44 % (ref 34.0–46.6)
Hemoglobin: 14.8 g/dL (ref 11.1–15.9)
Immature Grans (Abs): 0 10*3/uL (ref 0.0–0.1)
Immature Granulocytes: 0 %
Lymphocytes Absolute: 2.3 10*3/uL (ref 0.7–3.1)
Lymphs: 26 %
MCH: 30.7 pg (ref 26.6–33.0)
MCHC: 33.6 g/dL (ref 31.5–35.7)
MCV: 91 fL (ref 79–97)
Monocytes Absolute: 0.7 10*3/uL (ref 0.1–0.9)
Monocytes: 7 %
Neutrophils Absolute: 6 10*3/uL (ref 1.4–7.0)
Neutrophils: 65 %
Platelets: 298 10*3/uL (ref 150–450)
RBC: 4.82 x10E6/uL (ref 3.77–5.28)
RDW: 12.6 % (ref 11.7–15.4)
WBC: 9.1 10*3/uL (ref 3.4–10.8)

## 2024-01-09 LAB — THYROID PANEL WITH TSH
Free Thyroxine Index: 1 — ABNORMAL LOW (ref 1.2–4.9)
T3 Uptake Ratio: 21 % — ABNORMAL LOW (ref 24–39)
T4, Total: 4.7 ug/dL (ref 4.5–12.0)
TSH: 33.4 u[IU]/mL — ABNORMAL HIGH (ref 0.450–4.500)

## 2024-01-09 LAB — LIPID PANEL
Chol/HDL Ratio: 3.5 ratio (ref 0.0–4.4)
Cholesterol, Total: 208 mg/dL — ABNORMAL HIGH (ref 100–199)
HDL: 60 mg/dL (ref >39)
LDL Chol Calc (NIH): 128 mg/dL — ABNORMAL HIGH (ref 0–99)
Triglycerides: 114 mg/dL (ref 0–149)
VLDL Cholesterol Cal: 20 mg/dL (ref 5–40)

## 2024-01-09 LAB — VITAMIN D 25 HYDROXY (VIT D DEFICIENCY, FRACTURES): Vit D, 25-Hydroxy: 21.8 ng/mL — ABNORMAL LOW (ref 30.0–100.0)

## 2024-01-09 MED ORDER — LEVOTHYROXINE SODIUM 75 MCG PO TABS: 75.0000 ug | ORAL_TABLET | Freq: Every day | ORAL | 1 refills | Status: DC

## 2024-01-09 NOTE — Addendum Note (Signed)
 Addended by: Sonny Masters on: 01/09/2024 12:29 PM   Modules accepted: Orders

## 2024-01-11 ENCOUNTER — Ambulatory Visit: Payer: Medicaid Other | Admitting: Family Medicine

## 2024-01-11 LAB — URINE CULTURE

## 2024-01-14 ENCOUNTER — Telehealth: Payer: Self-pay | Admitting: Family Medicine

## 2024-01-14 MED ORDER — SULFAMETHOXAZOLE-TRIMETHOPRIM 800-160 MG PO TABS: 1.0000 | ORAL_TABLET | Freq: Two times a day (BID) | ORAL | 0 refills | Status: AC

## 2024-01-14 NOTE — Addendum Note (Signed)
 Addended by: Sonny Masters on: 01/14/2024 04:50 PM   Modules accepted: Orders

## 2024-01-14 NOTE — Telephone Encounter (Signed)
 Copied from CRM [id number]. Topic: General - Other >> Jan 14, 2024  3:19 PM Emylou G wrote: Reason for CRM: pls call patient.. wanted to see urine lab results.. Pls call her [phone]

## 2024-01-15 NOTE — Telephone Encounter (Signed)
 Spoke with paitnet, read results, informed patient that antibiotic was sent to pharmacy. Patient verbalized understanding. [facility] 01/15/24

## 2024-04-04 ENCOUNTER — Other Ambulatory Visit (HOSPITAL_COMMUNITY): Payer: Self-pay

## 2024-04-04 ENCOUNTER — Ambulatory Visit (INDEPENDENT_AMBULATORY_CARE_PROVIDER_SITE_OTHER)

## 2024-04-04 VITALS — Ht 60.0 in | Wt 310.0 lb

## 2024-04-04 DIAGNOSIS — Z5320 Procedure and treatment not carried out because of patient's decision for unspecified reasons: Secondary | ICD-10-CM

## 2024-04-04 DIAGNOSIS — Z7189 Other specified counseling: Secondary | ICD-10-CM | POA: Diagnosis not present

## 2024-04-04 DIAGNOSIS — Z0001 Encounter for general adult medical examination with abnormal findings: Secondary | ICD-10-CM

## 2024-04-04 DIAGNOSIS — Z0000 Encounter for general adult medical examination without abnormal findings: Secondary | ICD-10-CM

## 2024-04-04 DIAGNOSIS — Z1211 Encounter for screening for malignant neoplasm of colon: Secondary | ICD-10-CM

## 2024-04-04 DIAGNOSIS — Z5982 Transportation insecurity: Secondary | ICD-10-CM

## 2024-04-04 MED ORDER — LEVOTHYROXINE SODIUM 75 MCG PO TABS
75.0000 ug | ORAL_TABLET | Freq: Every day | ORAL | 1 refills | Status: DC
  Filled 2024-04-14: qty 90, 90d supply, fill #0

## 2024-04-04 MED ORDER — CITALOPRAM HYDROBROMIDE 40 MG PO TABS
40.0000 mg | ORAL_TABLET | Freq: Every day | ORAL | 5 refills | Status: AC
  Filled 2024-04-14: qty 30, 30d supply, fill #0
  Filled 2024-09-01: qty 30, 30d supply, fill #1

## 2024-04-04 MED ORDER — RISPERIDONE 2 MG PO TABS: 2.0000 mg | ORAL_TABLET | Freq: Every day | ORAL | 1 refills | Status: AC

## 2024-04-04 MED ORDER — RISPERIDONE 2 MG PO TABS
2.0000 mg | ORAL_TABLET | Freq: Every day | ORAL | 2 refills | Status: AC
  Filled 2024-04-14: qty 30, 30d supply, fill #0
  Filled 2024-09-01: qty 30, 30d supply, fill #1

## 2024-04-04 NOTE — Patient Instructions (Signed)
 Ms. Blok ,  Thank you for taking time out of your busy schedule to complete your Annual Wellness Visit with me. I enjoyed our conversation and look forward to speaking with you again next year. I, as well as your care team,  appreciate your ongoing commitment to your health goals. Please review the following plan we discussed and let me know if I can assist you in the future.  I enjoyed our conversation and look forward to it again next year. Blessing for the upcoming year!!  -Shawnetta Lein  Your Game plan/ To Do List    Referrals:  A referral has been placed for you to check and see what additional resources are available to you.   If you haven't heard from anyone within the next 7 business days, please call them and let them know a referral has been placed for you Phone: [phone]  Lab Orders         Cologuard    The lab will send you the kit. Complete it, send it back for testing. The office will let you know the results.   Follow up Visits: 1 Year Follow Up AWV: April 06, 2025 at 3:50 pm telephone visit   Next appointment with PCP: April 22, 2024 at 2:35 pm in office.   Clinician Recommendations:  Aim for 30 minutes of exercise or brisk walking, 6-8 glasses of water, and 5 servings of fruits and vegetables each day.       This is a list of the screening recommended for you and due dates:  Health Maintenance  Topic Date Due   Eye exam for diabetics  Never done   Yearly kidney health urinalysis for diabetes  Never done   DTaP/Tdap/Td vaccine (1 - Tdap) Never done   Pap with HPV screening  Never done   Colon Cancer Screening  Never done   Mammogram  Never done   Zoster (Shingles) Vaccine (1 of 2) Never done   Complete foot exam   03/21/2023   COVID-19 Vaccine (1 - 2024-25 season) Never done   Flu Shot  05/16/2024   Hemoglobin A1C  07/10/2024   Yearly kidney function blood test for diabetes  01/07/2025   Medicare Annual Wellness Visit  04/04/2025   Hepatitis C Screening  Completed    HIV Screening  Completed   HPV Vaccine  Aged Out   Meningitis B Vaccine  Aged Out    Advanced directives: (Provided) Advance directive discussed with you today. I have provided a copy for you to complete at home and have notarized. Once this is complete, please bring a copy in to our office so we can scan it into your chart.  Advance Care Planning is important because it:  [x]  Makes sure you receive the medical care that is consistent with your values, goals, and preferences  [x]  It provides guidance to your family and loved ones and reduces their decisional burden about whether or not they are making the right decisions based on your wishes.  Follow the link provided in your after visit summary or read over the paperwork we have mailed to you to help you started getting your Advance Directives in place. If you need assistance in completing these, please reach out to us  so that we can help you!  Understanding Your Risk for Falls Millions of people have serious injuries from falls each year. It is important to understand your risk of falling. Talk with your health care provider about your risk and what  you can do to lower it. If you do have a serious fall, make sure to tell your provider. Falling once raises your risk of falling again. How can falls affect me? Serious injuries from falls are common. These include: Broken bones, such as hip fractures. Head injuries, such as traumatic brain injuries (TBI) or concussions. A fear of falling can cause you to avoid activities and stay at home. This can make your muscles weaker and raise your risk for a fall. What can increase my risk? There are a number of risk factors that increase your risk for falling. The more risk factors you have, the higher your risk of falling. Serious injuries from a fall happen most often to people who are older than 60 years old. Teenagers and young adults ages 71-29 are also at higher risk. Common risk factors  include: Weakness in the lower body. Being generally weak or confused due to long-term (chronic) illness. Dizziness or balance problems. Poor vision. Medicines that cause dizziness or drowsiness. These may include: Medicines for your blood pressure, heart, anxiety, insomnia, or swelling (edema). Pain medicines. Muscle relaxants. Other risk factors include: Drinking alcohol. Having had a fall in the past. Having foot pain or wearing improper footwear. Working at a dangerous job. Having any of the following in your home: Tripping hazards, such as floor clutter or loose rugs. Poor lighting. Pets. Having dementia or memory loss. What actions can I take to lower my risk of falling?     Physical activity Stay physically fit. Do strength and balance exercises. Consider taking a regular class to build strength and balance. Yoga and tai chi are good options. Vision Have your eyes checked every year and your prescription for glasses or contacts updated as needed. Shoes and walking aids Wear non-skid shoes. Wear shoes that have rubber soles and low heels. Do not wear high heels. Do not walk around the house in socks or slippers. Use a cane or walker as told by your provider. Home safety Attach secure railings on both sides of your stairs. Install grab bars for your bathtub, shower, and toilet. Use a non-skid mat in your bathtub or shower. Attach bath mats securely with double-sided, non-slip rug tape. Use good lighting in all rooms. Keep a flashlight near your bed. Make sure there is a clear path from your bed to the bathroom. Use night-lights. Do not use throw rugs. Make sure all carpeting is taped or tacked down securely. Remove all clutter from walkways and stairways, including extension cords. Repair uneven or broken steps and floors. Avoid walking on icy or slippery surfaces. Walk on the grass instead of on icy or slick sidewalks. Use ice melter to get rid of ice on walkways in the  winter. Use a cordless phone. Questions to ask your health care provider Can you help me check my risk for a fall? Do any of my medicines make me more likely to fall? Should I take a vitamin D  supplement? What exercises can I do to improve my strength and balance? Should I make an appointment to have my vision checked? Do I need a bone density test to check for weak bones (osteoporosis)? Would it help to use a cane or a walker? Where to find more information Centers for Disease Control and Prevention, STEADI: TonerPromos.no Community-Based Fall Prevention Programs: TonerPromos.no [employer] on Aging: BaseRingTones.pl Contact a health care provider if: You fall at home. You are afraid of falling at home. You feel weak, drowsy, or dizzy. This  information is not intended to replace advice given to you by your health care provider. Make sure you discuss any questions you have with your health care provider. Document Revised: 06/05/2022 Document Reviewed: 06/05/2022 Elsevier Patient Education  2024 [employer].

## 2024-04-04 NOTE — Progress Notes (Signed)
 Please attest and cosign this visit due to patients primary care provider not being in the office at the time the visit was completed.   Subjective:   Amber Acevedo is a 58 y.o. who presents for a Medicare Wellness preventive visit.  As a reminder, Annual Wellness Visits don't include a physical exam, and some assessments may be limited, especially if this visit is performed virtually. We may recommend an in-person follow-up visit with your provider if needed.  Visit Complete: Virtual I connected with  Nekesha T Stupka on 04/04/24 by a audio enabled telemedicine application and verified that I am speaking with the correct person using two identifiers.  Patient Location: Home  Provider Location: Home Office  I discussed the limitations of evaluation and management by telemedicine. The patient expressed understanding and agreed to proceed.  Vital Signs: Because this visit was a virtual/telehealth visit, some criteria may be missing or patient reported. Any vitals not documented were not able to be obtained and vitals that have been documented are patient reported.  VideoDeclined- This patient declined Librarian, academic. Therefore the visit was completed with audio only.  Persons Participating in Visit: Patient.  AWV Questionnaire: No: Patient Medicare AWV questionnaire was not completed prior to this visit.  Cardiac Risk Factors include: advanced age (>73men, >103 women);dyslipidemia;hypertension;obesity (BMI >30kg/m2);sedentary lifestyle     Objective:    Today's Vitals   04/04/24 1046  Weight: (!) 310 lb (140.6 kg)  Height: 5' (1.524 m)   Body mass index is 60.54 kg/m.     04/04/2024   10:57 AM 11/26/2023    4:49 PM 05/29/2022    4:39 PM 09/10/2016   11:49 AM 06/02/2015    5:00 PM 03/17/2015    7:03 PM  Advanced Directives  Does Patient Have a Medical Advance Directive? No No No No     Would patient like information on creating a medical advance  directive? Yes (MAU/Ambulatory/Procedural Areas - Information given) No - Patient declined No - Patient declined No - Patient declined        Information is confidential and restricted. Go to Review Flowsheets to unlock data.   Data saved with a previous flowsheet row definition    Current Medications (verified) Outpatient Encounter Medications as of 04/04/2024  Medication Sig   aspirin EC 81 MG tablet Take 81 mg by mouth daily. Swallow whole.   benztropine (COGENTIN) 1 MG tablet 1 tablet at bedtime.   Cholecalciferol (VITAMIN D -3) 125 MCG (5000 UT) TABS Take by mouth.   citalopram (CELEXA) 40 MG tablet Take 40 mg by mouth daily.   levothyroxine  (SYNTHROID ) 75 MCG tablet Take 1 tablet (75 mcg total) by mouth daily.   risperiDONE (RISPERDAL) 2 MG tablet Take 2 mg by mouth at bedtime.   No facility-administered encounter medications on file as of 04/04/2024.    Allergies (verified) Amoxicillin   History: Past Medical History:  Diagnosis Date   Arthritis    Diabetes mellitus without complication (HCC)    Hypertension    Thyroid  disease    Past Surgical History:  Procedure Laterality Date   APPENDECTOMY     CESAREAN SECTION     KNEE SURGERY     Family History  Problem Relation Age of Onset   Diabetes Mother    Hypertension Mother    Kidney disease Mother    COPD Sister    Heart disease Brother    Diabetes Paternal Grandmother    Social History   Socioeconomic History  Marital status: Married    Spouse name: Not on file   Number of children: Not on file   Years of education: Not on file   Highest education level: Some college, no degree  Occupational History   Not on file  Tobacco Use   Smoking status: Former    Current packs/day: 0.00    Types: Cigarettes    Quit date: 04/06/2019    Years since quitting: 5.0   Smokeless tobacco: Never  Vaping Use   Vaping status: Never Used  Substance and Sexual Activity   Alcohol use: No   Drug use: No   Sexual activity:  Not Currently  Other Topics Concern   Not on file  Social History Narrative   Not on file   Social Drivers of Health   Financial Resource Strain: Low Risk  (04/04/2024)   Overall Financial Resource Strain (CARDIA)    Difficulty of Paying Living Expenses: Not hard at all  Food Insecurity: No Food Insecurity (04/04/2024)   Hunger Vital Sign    Worried About Running Out of Food in the Last Year: Never true    Ran Out of Food in the Last Year: Never true  Transportation Needs: Unmet Transportation Needs (04/04/2024)   PRAPARE - Transportation    Lack of Transportation (Medical): Yes    Lack of Transportation (Non-Medical): Yes  Physical Activity: Inactive (04/04/2024)   Exercise Vital Sign    Days of Exercise per Week: 0 days    Minutes of Exercise per Session: 0 min  Stress: Stress Concern Present (04/04/2024)   [employer] of Occupational Health - Occupational Stress Questionnaire    Feeling of Stress: To some extent  Social Connections: Socially Isolated (04/04/2024)   Social Connection and Isolation Panel    Frequency of Communication with Friends and Family: More than three times a week    Frequency of Social Gatherings with Friends and Family: Never    Attends Religious Services: Never    Database administrator or Organizations: No    Attends Banker Meetings: Never    Marital Status: Widowed    Tobacco Counseling Counseling given: Yes    Clinical Intake:  Pre-visit preparation completed: Yes  Pain : No/denies pain     BMI - recorded: 60.54 Nutritional Status: BMI > 30  Obese Nutritional Risks: None Diabetes: Yes CBG done?: No (telehealth visit.) Did pt. bring in CBG monitor from home?: No  Lab Results  Component Value Date   HGBA1C 6.4 (H) 01/08/2024   HGBA1C 6.2 (H) 03/20/2022   HGBA1C 5.5 06/02/2015     How often do you need to have someone help you when you read instructions, pamphlets, or other written materials from your doctor or  pharmacy?: 1 - Never  Interpreter Needed?: No  Information entered by :: Sally Crazier CMA   Activities of Daily Living     04/04/2024   10:59 AM  In your present state of health, do you have any difficulty performing the following activities:  Hearing? 0  Vision? 0  Difficulty concentrating or making decisions? 0  Walking or climbing stairs? 1  Dressing or bathing? 1  Doing errands, shopping? 1  Preparing Food and eating ? Y  Using the Toilet? Y  In the past six months, have you accidently leaked urine? Y  Do you have problems with loss of bowel control? N  Managing your Medications? N  Managing your Finances? N  Housekeeping or managing your Housekeeping? Fernande Howells  Patient Care Team: Galvin Jules, FNP as PCP - General (Family Medicine) Pleasant Brilliant, MD as Consulting Physician (Orthopedic Surgery) [facility] as Consulting Physician (Psychiatry)  I have updated your Care Teams any recent Medical Services you may have received from other providers in the past year.     Assessment:   This is a routine wellness examination for Shaena.  Hearing/Vision screen Hearing Screening - Comments:: Patient denies any hearing difficulties.   Vision Screening - Comments:: Patient wears reading glasses only. Up to date with yearly exams.  Patient is not up to date on yearly eye exams.  Patient declined referral to establish with an ophthalmologist.     Goals Addressed             This Visit's Progress    Patient Stated   On track    Find a new handicap accommodating apartment.     Patient Stated       Lose enough weight so that I can have THA surgery.        Depression Screen  Patient agrees to a referral for counseling. She refuses any community resources.     04/04/2024   11:02 AM 01/08/2024    2:28 PM 06/13/2022    1:16 PM 05/29/2022    4:39 PM 05/29/2022    4:36 PM 05/15/2022    1:57 PM 03/20/2022    3:05 PM  PHQ 2/9 Scores  PHQ - 2 Score 1 2 0 1 1 1 4    PHQ- 9 Score 14 9     14     Fall Risk     04/04/2024   10:58 AM 01/08/2024    2:28 PM 06/13/2022    1:16 PM 05/29/2022    4:39 PM 05/15/2022    1:57 PM  Fall Risk   Falls in the past year? 0 0 0 0 0  Number falls in past yr: 0 0 0 0 0  Injury with Fall? 0 0 0 0 0  Risk for fall due to : Impaired balance/gait;Orthopedic patient;Impaired mobility;Other (Comment) History of fall(s) No Fall Risks No Fall Risks No Fall Risks  Risk for fall due to: Comment wheelchair bound      Follow up Falls evaluation completed;Education provided;Falls prevention discussed Falls evaluation completed Falls evaluation completed  Falls evaluation completed  Falls evaluation completed      Data saved with a previous flowsheet row definition    MEDICARE RISK AT HOME:  Medicare Risk at Home Any stairs in or around the home?: Yes If so, are there any without handrails?: No Home free of loose throw rugs in walkways, pet beds, electrical cords, etc?: Yes Adequate lighting in your home to reduce risk of falls?: Yes Life alert?: No Use of a cane, walker or w/c?: Yes Grab bars in the bathroom?: No Shower chair or bench in shower?: No Elevated toilet seat or a handicapped toilet?: No  TIMED UP AND GO:  Was the test performed?  No  Cognitive Function: 6CIT completed    05/29/2022    4:40 PM  MMSE - Mini Mental State Exam  Not completed: Unable to complete        04/04/2024   11:18 AM 05/29/2022    4:40 PM  6CIT Screen  What Year? 0 points 0 points  What month? 0 points 0 points  What time? 0 points 0 points  Count back from 20 0 points 0 points  Months in reverse 0 points 0 points  Repeat phrase 0 points 0 points  Total Score 0 points 0 points    Immunizations There is no immunization history for the selected administration types on file for this patient.  Screening Tests Health Maintenance  Topic Date Due   OPHTHALMOLOGY EXAM  Never done   Diabetic kidney evaluation - Urine ACR  Never done    DTaP/Tdap/Td (1 - Tdap) Never done   Cervical Cancer Screening (HPV/Pap Cotest)  Never done   Colonoscopy  Never done   MAMMOGRAM  Never done   Zoster Vaccines- Shingrix (1 of 2) Never done   FOOT EXAM  03/21/2023   COVID-19 Vaccine (1 - 2024-25 season) Never done   INFLUENZA VACCINE  05/16/2024   HEMOGLOBIN A1C  07/10/2024   Diabetic kidney evaluation - eGFR measurement  01/07/2025   Medicare Annual Wellness (AWV)  04/04/2025   Hepatitis C Screening  Completed   HIV Screening  Completed   HPV VACCINES  Aged Out   Meningococcal B Vaccine  Aged Out    Health Maintenance  Health Maintenance Due  Topic Date Due   OPHTHALMOLOGY EXAM  Never done   Diabetic kidney evaluation - Urine ACR  Never done   DTaP/Tdap/Td (1 - Tdap) Never done   Cervical Cancer Screening (HPV/Pap Cotest)  Never done   Colonoscopy  Never done   MAMMOGRAM  Never done   Zoster Vaccines- Shingrix (1 of 2) Never done   FOOT EXAM  03/21/2023   COVID-19 Vaccine (1 - 2024-25 season) Never done   Health Maintenance Items Addressed: Patient refuses mammogram and referral for cervical cancer screening. States she is unable to stand long enough for mammogram and can not get on the table for cervical cancer screening Cologuard ordered. Patient refuses referral for eye exam.  Patient is due for a diabetic foot exam. Please complete at the next office visit.  Patient is requesting a home glucose monitor kit. That prescription can be sent to [facility] for delivery.   Additional Screening:  Vision Screening: Recommended annual ophthalmology exams for early detection of glaucoma and other disorders of the eye. Would you like a referral to an eye doctor? No   Not up to date with yearly eye exams. Declined referral today Dental Screening: Recommended annual dental exams for proper oral hygiene  Community Resource Referral / Chronic Care Management: CRR required this visit?  Yes   CCM required this  visit?  PCP informed of CCM need   Plan:   Patient refuses referral for a yearly mammogram and cervical cancer screening. CRR placed for assistance with transportation. Patient is interested in therapy/counseling in addition to her current treatment plan of medications prescribed by Dr. Margarie Shay with [facility] in [place]. Cologuard ordered.     I have personally reviewed and noted the following in the patient's chart:   Medical and social history Use of alcohol, tobacco or illicit drugs  Current medications and supplements including opioid prescriptions. Patient is not currently taking opioid prescriptions. Functional ability and status Nutritional status Physical activity Advanced directives List of other physicians Hospitalizations, surgeries, and ER visits in previous 12 months Vitals Screenings to include cognitive, depression, and falls Referrals and appointments  In addition, I have reviewed and discussed with patient certain preventive protocols, quality metrics, and best practice recommendations. A written personalized care plan for preventive services as well as general preventive health recommendations were provided to patient.   Fatimah Sundquist, CMA   04/04/2024   After  Visit Summary: (MyChart) Due to this being a telephonic visit, the after visit summary with patients personalized plan was offered to patient via MyChart   Notes: Please see note under plan section of this progress note.

## 2024-04-07 ENCOUNTER — Telehealth: Payer: Self-pay

## 2024-04-07 NOTE — Progress Notes (Signed)
 Complex Care Management Note  Care Guide Note 04/07/2024 Name: Amber Acevedo MRN: [id number] DOB: [birth year]  [name] is a 58 y.o. year old female who sees Rakes, Rock HERO, FNP for primary care. I reached out to [name] by phone today to offer complex care management services.  [name] was given information about Complex Care Management services today including:   The Complex Care Management services include support from the care team which includes your Nurse Care Manager, Clinical Social Worker, or Pharmacist.  The Complex Care Management team is here to help remove barriers to the health concerns and goals most important to you. Complex Care Management services are voluntary, and the patient may decline or stop services at any time by request to their care team member.   Complex Care Management Consent Status: Patient agreed to services and verbal consent obtained.   Follow up plan:  Telephone appointment with complex care management team member scheduled for:  04/14/2024  Encounter Outcome:  Patient Scheduled  Jeoffrey Buffalo , RMA     [place]  [facility], [facility] Guide  Direct Dial: [phone]  Website: [email or web address]

## 2024-04-11 ENCOUNTER — Telehealth: Payer: Self-pay | Admitting: Family Medicine

## 2024-04-11 NOTE — Telephone Encounter (Signed)
 Copied from CRM [id number]. Topic: Clinical - Prescription Issue >> Apr 11, 2024  3:39 PM Ivette P wrote: Reason for CRM: PT called in because she said she spoke to someone on Friday last week for AWV and was told they would start to delivery her medication.    Pt has yet to received medicaiton and is running out. Pt does know know the name of the pharmacy that Nurse mentioned while on call and said it would be Home Delivery.   Can someone follow up with patient to confirm pharmacy details she was told to be able to send in a med refill request.   Medications needed: citalopram  (CELEXA ) 40 MG tablet levothyroxine  (SYNTHROID ) 75 MCG tablet risperiDONE  (RISPERDAL ) 2 MG tablet

## 2024-04-14 ENCOUNTER — Other Ambulatory Visit: Payer: Self-pay

## 2024-04-14 ENCOUNTER — Other Ambulatory Visit: Payer: Self-pay | Admitting: Licensed Clinical Social Worker

## 2024-04-14 ENCOUNTER — Other Ambulatory Visit (HOSPITAL_COMMUNITY): Payer: Self-pay

## 2024-04-14 NOTE — Patient Instructions (Signed)
 Visit Information  Thank you for taking time to visit with me today. Please don't hesitate to contact me if I can be of assistance to you before our next scheduled appointment.  Our next appointment is by telephone on 05/06/24 at 9 am Please call the care guide team at [phone] if you need to cancel or reschedule your appointment.   Following is a copy of your care plan:   Goals Addressed             This Visit's Progress    LCSW-VBCI Social Work Care Plan       Problems:   ADL IADL limitations, Inability to perform ADL's independently, Inability to perform IADL's independently, Mental Health Concerns , Social Isolation, and Transportation, Disease Management support and education needs related to Depression: depressed mood, feelings of worthlessness, guilt, and loss of energy/fatigue and Grief, and Social Connections  CSW Clinical Goal(s):   Over the next 90 days the Patient will attend all scheduled medical appointments as evidenced by patient report and care team review of appointment completion in electronic MEDICAL RECORD NUMBERby VBCI LCSW demonstrate a reduction in symptoms related to Anxiety with Excessive Worry, Grief  explore community resource options for unmet needs related to Depression   and Transportation.  Interventions:  Social Determinants of Health in Patient with Grief Stress at managing health conditions: SDOH assessments completed: Alcohol/Substance Use, Depression  , Financial Strain , Food Insecurity , Housing , Intimate Partner Violence, Physical Activity, Social Connections, Stress, Tobacco Use, and Transportation Evaluation of current treatment plan related to unmet needs Transportation resources: VBCI BSW appointment made to assist with specific wheelchair bound transportation barriers that patient is experiencing in her area Mental Health:  Evaluation of current treatment plan related to Depression: anxiety and Grief Active listening / Reflection  utilized Mining engineer reviewed Financial risk analyst / information provided Depression screen reviewed Emotional Support Provided Mindfulness or Relaxation training provided Motivational Interviewing employed Participation in counseling encourage : patient wishes to take her time in considering this resource support PHQ2/PHQ9 completed Problem Solving /Task Center strategies reviewed Provided general psycho-education for mental health needs Quality of sleep assessed & Sleep Hygiene techniques promoted Reviewed mental health medications and discussed importance of compliance: [facility] manages her psychiatric medications but patient is open to transitioning her care to another practice if needed Solution-Focued Strategies employed: Suicidal Ideation/Homicidal Ideation assessed: No SI/HI  Patient Goals/Self-Care Activities:  Connect with provider for ongoing mental health treatment.   Increase coping skills, healthy habits, self-management skills, and stress reduction  Plan:   The care management team will reach out to the patient again over the next 30 days.        Please call the Suicide and Crisis Lifeline: 988 call the [facility]: [phone] if you are experiencing a Mental Health or Behavioral Health Crisis or need someone to talk to.  Patient verbalizes understanding of instructions and care plan provided today and agrees to view in MyChart. Active MyChart status and patient understanding of how to access instructions and care plan via MyChart confirmed with patient.     Lyle Rung, BSW, MSW, LCSW Licensed Clinical Social Worker [employer]   [email or web address] Direct Dial: [phone]

## 2024-04-14 NOTE — Telephone Encounter (Signed)
 Pt made aware that her refills were sent to [facility]. Number provided and pt will call them to set up mail order.

## 2024-04-14 NOTE — Patient Outreach (Signed)
 Complex Care Management   Visit Note  04/14/2024  Name:  Amber Acevedo MRN: [id number] DOB: 1966/09/28  Situation: Referral received for Complex Care Management related to SDOH Barriers:  Depression I obtained verbal consent from Patient.  Visit completed with VBCI LCSW  on the phone  Background:   Past Medical History:  Diagnosis Date   Arthritis    Diabetes mellitus without complication (HCC)    Hypertension    Thyroid  disease     Assessment: Patient Reported Symptoms:  Cognitive Cognitive Status: Able to follow simple commands, Normal speech and language skills, Alert and oriented to person, place, and time Cognitive/Intellectual Conditions Management [RPT]: None reported or documented in medical history or problem list   Health Maintenance Behaviors: Annual physical exam Healing Pattern: Average Health Facilitated by: Pain control, Rest  Neurological Neurological Review of Symptoms: No symptoms reported    HEENT HEENT Symptoms Reported: No symptoms reported      Cardiovascular Cardiovascular Symptoms Reported: No symptoms reported Does patient have uncontrolled Hypertension?: No (Hypternsion dx but patient reports it being controlled at this time since losing 7 pounds) Cardiovascular Management Strategies: Adequate rest  Respiratory Respiratory Symptoms Reported: No symptoms reported    Endocrine Endocrine Symptoms Reported: No symptoms reported Is patient diabetic?: No (Prediabetic) Endocrine Self-Management Outcome: 4 (good)  Gastrointestinal Gastrointestinal Symptoms Reported: No symptoms reported      Genitourinary Genitourinary Symptoms Reported: Frequency (Urinary Frequency) Genitourinary Management Strategies: Adequate rest Genitourinary Self-Management Outcome: 3 (uncertain)  Integumentary Integumentary Symptoms Reported: No symptoms reported    Musculoskeletal Musculoskelatal Symptoms Reviewed: Back pain, Joint pain, Weakness, Limited mobility, Difficulty  walking, Unsteady gait, Muscle pain Additional Musculoskeletal Details: Wheelchair bound Musculoskeletal Management Strategies: Medication therapy Musculoskeletal Self-Management Outcome: 2 (bad) Falls in the past year?: No Number of falls in past year: 1 or less Was there an injury with Fall?: No Fall Risk Category Calculator: 0 Patient Fall Risk Level: Low Fall Risk    Psychosocial Psychosocial Symptoms Reported: Depression - if selected complete PHQ 2-9, Sadness - if selected complete PHQ 2-9 Additional Psychological Details: Patient has been going to [facility] for several years for psychiatry. She denies any current SI/HI and does report increased stress around her health management. Healthy coping skill education provided. Patient reports feeling overwhelming during assesesment questions and prefers to approach her goals at her own pace. Patient will consider outpatient indiviudal therapy over the next two weeks. Behavioral Management Strategies: Community resources, Medication therapy, Optimal nutrition intake, Adequate rest Behavioral Health Self-Management Outcome: 3 (uncertain) Behavioral Health Comment: My medicine helps but only so much Major Change/Loss/Stressor/Fears (CP): Death of a loved one, Medical condition, self Techniques to Cope with Loss/Stress/Change: Medication, Withdraw Quality of Family Relationships: involved, supportive Do you feel physically threatened by others?: No      04/14/2024    9:42 AM  Depression screen PHQ 2/9  Decreased Interest 1  Down, Depressed, Hopeless 1  PHQ - 2 Score 2  Altered sleeping 3  Tired, decreased energy 3  Change in appetite 3  Feeling bad or failure about yourself  2  Trouble concentrating 2  Moving slowly or fidgety/restless 0  Suicidal thoughts 0  PHQ-9 Score 15  Difficult doing work/chores Somewhat difficult      04/14/2024   10:22 AM 01/08/2024    2:29 PM  GAD 7 : Generalized Anxiety Score  Nervous, Anxious, on Edge 1  1  Control/stop worrying 1 1  Worry too much - different things 1 1  Trouble relaxing 1 1  Restless 1 1  Easily annoyed or irritable 0 0  Afraid - awful might happen 1 1  Total GAD 7 Score 6 6  Anxiety Difficulty Somewhat difficult Somewhat difficult    SDOH Screenings   Food Insecurity: No Food Insecurity (04/14/2024)  Housing: Low Risk  (04/14/2024)  Transportation Needs: Unmet Transportation Needs (04/14/2024)  Utilities: Not At Risk (04/14/2024)  Alcohol Screen: Low Risk  (04/14/2024)  Depression (PHQ2-9): High Risk (04/14/2024)  Financial Resource Strain: Low Risk  (04/14/2024)  Physical Activity: Inactive (04/14/2024)  Social Connections: Socially Isolated (04/14/2024)  Stress: Stress Concern Present (04/14/2024)  Tobacco Use: Medium Risk (04/04/2024)  Health Literacy: Adequate Health Literacy (04/14/2024)    There were no vitals filed for this visit.  Medications Reviewed Today     Reviewed by Merlynn Lyle CROME, LCSW (Social Worker) on 04/14/24 at [phone]  Med List Status: <None>   Medication Order Taking? Sig Documenting Provider Last Dose Status Informant  aspirin EC 81 MG tablet [id number]  Take 81 mg by mouth daily. Swallow whole. [provider]  Active   benztropine (COGENTIN) 1 MG tablet [id number]  1 tablet at bedtime. [provider]  Active Self           Med Note LILIAN BRUNO JONELLE Austin Sep 10, 2016  2:40 PM) Received from: External Pharmacy  Cholecalciferol (VITAMIN D -3) 125 MCG (5000 UT) TABS [id number]  Take by mouth. [provider]  Active   citalopram  (CELEXA ) 40 MG tablet [id number]  Take 40 mg by mouth daily. [provider]  Active   citalopram  (CELEXA ) 40 MG tablet [id number]  Take 1 tablet (40 mg total) by mouth daily.   Active   levothyroxine  (SYNTHROID ) 75 MCG tablet [id number]  Take 1 tablet (75 mcg total) by mouth daily. [place] HERO, FNP  Active   levothyroxine  (SYNTHROID ) 75 MCG tablet [id number]  Take 1 tablet (75 mcg total)  by mouth daily. [place] HERO, FNP  Active   risperiDONE  (RISPERDAL ) 2 MG tablet [id number]  Take 2 mg by mouth at bedtime. [provider]  Active   risperiDONE  (RISPERDAL ) 2 MG tablet [id number]  Take 1 tablet (2 mg total) by mouth at bedtime.   Active   risperiDONE  (RISPERDAL ) 2 MG tablet [id number]  Take 1 tablet (2 mg total) by mouth at bedtime.   Active             Recommendation:   PCP Follow-up Continue Current Plan of Care  Follow Up Plan:   Telephone follow-up in 1 month  Lyle Merlynn, BSW, MSW, LCSW Licensed Clinical Social Worker [employer]   [email or web address] Direct Dial: [phone]

## 2024-04-16 LAB — COLOGUARD

## 2024-04-17 ENCOUNTER — Other Ambulatory Visit: Payer: Self-pay

## 2024-04-17 ENCOUNTER — Ambulatory Visit: Payer: Self-pay | Admitting: Family Medicine

## 2024-04-17 DIAGNOSIS — Z1211 Encounter for screening for malignant neoplasm of colon: Secondary | ICD-10-CM

## 2024-04-22 ENCOUNTER — Encounter: Admitting: Family Medicine

## 2024-04-29 ENCOUNTER — Other Ambulatory Visit: Payer: Self-pay

## 2024-04-29 ENCOUNTER — Telehealth: Payer: Self-pay | Admitting: *Deleted

## 2024-04-29 NOTE — Patient Instructions (Signed)
 Visit Information  Thank you for taking time to visit with me today. Please don't hesitate to contact me if I can be of assistance to you before our next scheduled appointment.  Our next appointment is by telephone on 05/13/24 at 10am Please call the care guide team at [phone] if you need to cancel or reschedule your appointment.   Following is a copy of your care plan:   Goals Addressed             This Visit's Progress    BSW VBCI Social Work Care Plan       Problems:   Transportation and Personal Care  CSW Clinical Goal(s):   Over the next 2 weeks the Patient will will follow up with purchasing a threshold ramp and contacting [employer] for transportation as directed by Social Work.  Interventions:  Social Determinants of Health in Patient with HTN: SDOH assessments completed: Transportation and Personal Care Services Evaluation of current treatment plan related to unmet needs SW referred patient to resources to purchase online threshold ramp to exit her home with the wheelchair.  SW t/c [facility] and referred to [employer] for transportation.  SW t/c [facility] Nav[facility] but could not connect with staff (line was disconnected).  SW will communicate with doctor for PCS orders.  Patient Goals/Self-Care Activities:  Patient with work with her daughter to purchase a threshold ramp and contact [employer] for transportation to appointments.  Plan:   Telephone follow up appointment with care management team member scheduled for:  05/13/24 at 10am.        Please call 911 if you are experiencing a Mental Health or Behavioral Health Crisis or need someone to talk to.  Patient verbalizes understanding of instructions and care plan provided today and agrees to view in MyChart. Active MyChart status and patient understanding of how to access instructions and care plan via MyChart confirmed with patient.     Tillman Gardener, BSW [place]  [facility],  [facility] Social Worker Direct Dial: [phone]  Fax: [phone] Website: [email or web address]

## 2024-04-29 NOTE — Telephone Encounter (Signed)
 TC to patient, received PCS form to fill out, will NTBS since last OV on 01/08/24 is greater than 90 days. Due to lack of transportation pt wants to wait till her next appt on 08/28/24. Faxed this information back to [place] SW who requested this to be filled out.

## 2024-04-29 NOTE — Patient Outreach (Signed)
 Complex Care Management   Visit Note  04/29/2024  Name:  Amber Acevedo MRN: [id number] DOB: 06/02/1966  Situation: Referral received for Complex Care Management related to SDOH Barriers:  Transportation Personal Care Services. I obtained verbal consent from Patient.  Visit completed with patient  on the phone  Background:   Past Medical History:  Diagnosis Date   Arthritis    Diabetes mellitus without complication (HCC)    Hypertension    Thyroid  disease     Assessment:  Patient reports her daughter provides transportation and does errands.  Patient wants assistance with connecting to transportation to ease daughters support.  Patient has a wheelchair and is on the ground level, but there is a step out of her unit.  Patient has not been able to use transportation service because she can't exit the home without assistance.  SW suggest a threshold ramp that is very inexpensive and can be purchased online.  Patient agreed to speak to daughter to assist with purchase.  SW t/c [facility] and patient is eligible for transportation. Patient is provided [employer] [phone] for transportation appts.  Patient will schedule transportation once the ramp arrives. SW also inquires with [facility] about PCS.  SW is provided the [facility] but the line is disconnected after a long hold.  SW will submit PCS forms to provider to submit to [facility].  SDOH Interventions    Flowsheet Row Patient Outreach Telephone from 04/29/2024 in [place] POPULATION HEALTH DEPARTMENT Patient Outreach Telephone from 04/14/2024 in [place] POPULATION HEALTH DEPARTMENT Clinical Support from 04/04/2024 in [facility] Office Visit from 01/08/2024 in [facility] Clinical Support from 05/29/2022 in [facility]  SDOH Interventions       Food Insecurity Interventions Intervention Not Indicated Intervention Not Indicated Intervention Not  Indicated -- Intervention Not Indicated  Housing Interventions Intervention Not Indicated Intervention Not Indicated Intervention Not Indicated -- Intervention Not Indicated  Transportation Interventions Other (Comment)  [Will use insurance [employer] coverage] AMB Referral AMB Referral -- Intervention Not Indicated  Utilities Interventions Intervention Not Indicated Intervention Not Indicated Intervention Not Indicated -- --  Alcohol Usage Interventions -- -- Intervention Not Indicated (Score <7) -- --  Depression Interventions/Treatment  -- Currently on Treatment, Community Resources Provided  [employer] agreeable to consider therapy referral] Currently on Treatment, Counseling Currently on Treatment --  Financial Strain Interventions -- Intervention Not Indicated Intervention Not Indicated -- Intervention Not Indicated  Physical Activity Interventions -- Other (Comments)  [wheelchair bound//chronic bilateral hip pain// needs bilateral hip replacement surgery but needs to lose weight. working on that.] Other (Comments)  [wheelchair bound//chronic bilateral hip pain// needs bilateral hip replacement surgery but needs to lose weight. working on that.] -- Patient Refused  Stress Interventions -- Provide Counseling, [employer] Provided Other (Comment)  [referral placed. patient aware and in agreement with treatment plan.] -- Intervention Not Indicated  Social Connections Interventions -- Patient Declined Patient Declined  [patient declined referral and offer for community resources] -- Intervention Not Indicated  Tobacco Interventions -- -- -- -- Intervention Not Indicated  Health Literacy Interventions -- Intervention Not Indicated Intervention Not Indicated -- --    Recommendation:   SW will submit PCS form to provider.  Follow Up Plan:   Telephone follow up appointment date/time:  05/13/24 at 10am  Tillman Gardener, BSW [place]  [facility], [facility] Social  Worker Direct Dial: [phone]  Fax: [phone] Website:  [email or web address]

## 2024-05-06 ENCOUNTER — Other Ambulatory Visit: Payer: Self-pay | Admitting: Licensed Clinical Social Worker

## 2024-05-07 ENCOUNTER — Other Ambulatory Visit: Payer: Self-pay | Admitting: Licensed Clinical Social Worker

## 2024-05-07 NOTE — Patient Instructions (Signed)
 Visit Information  Thank you for taking time to visit with me today. Please don't hesitate to contact me if I can be of assistance to you before our next scheduled appointment.  Our next appointment is by telephone on 05/19/34 at 1 pm Please call the care guide team at [phone] if you need to cancel or reschedule your appointment.   Following is a copy of your care plan:   Goals Addressed             This Visit's Progress    LCSW-VBCI Social Work Care Plan       Problems:   ADL IADL limitations, Inability to perform ADL's independently, Inability to perform IADL's independently, Mental Health Concerns , Social Isolation, and Transportation, Disease Management support and education needs related to Depression: depressed mood, feelings of worthlessness, guilt, and loss of energy/fatigue and Grief, and Social Connections  CSW Clinical Goal(s):   Over the next 90 days the Patient will attend all scheduled medical appointments as evidenced by patient report and care team review of appointment completion in electronic MEDICAL RECORD NUMBERby VBCI LCSW demonstrate a reduction in symptoms related to Anxiety with Excessive Worry, Grief  explore community resource options for unmet needs related to Depression   and Transportation.  Interventions:  Social Determinants of Health in Patient with Grief Stress at managing health conditions: SDOH assessments completed: Alcohol/Substance Use, Depression  , Financial Strain , Food Insecurity , Housing , Intimate Partner Violence, Physical Activity, Social Connections, Stress, Tobacco Use, and Transportation Evaluation of current treatment plan related to unmet needs Transportation resources: VBCI BSW appointment made to assist with specific wheelchair bound transportation barriers that patient is experiencing in her area Mental Health:  Evaluation of current treatment plan related to Depression: anxiety and Grief Active listening / Reflection  utilized Mining engineer reviewed Financial risk analyst / information provided Depression screen reviewed Emotional Support Provided Mindfulness or Relaxation training provided Motivational Interviewing employed Participation in counseling encourage : patient wishes to take her time in considering this resource support PHQ2/PHQ9 completed Problem Solving /Task Center strategies reviewed Provided general psycho-education for mental health needs Quality of sleep assessed & Sleep Hygiene techniques promoted Reviewed mental health medications and discussed importance of compliance: [facility] manages her psychiatric medications but patient is open to transitioning her care to another practice if needed. She has a follow up this Friday and will make decision after this appointment regarding changing her [facility] providers or increasing her level of care  Solution-Focued Strategies employed: Suicidal Ideation/Homicidal Ideation assessed: No SI/HI  Patient Goals/Self-Care Activities:  Connect with provider for ongoing mental health treatment.   Increase coping skills, healthy habits, self-management skills, and stress reduction  Plan:   The care management team will reach out to the patient again over the next 30 days.        Please call the Suicide and Crisis Lifeline: 988 call the [facility]: [phone] if you are experiencing a Mental Health or Behavioral Health Crisis or need someone to talk to.  Patient verbalizes understanding of instructions and care plan provided today and agrees to view in MyChart. Active MyChart status and patient understanding of how to access instructions and care plan via MyChart confirmed with patient.     Lyle Rung, BSW, MSW, LCSW Licensed Clinical Social Worker [employer] [facility]   [facility] [email or web address] Direct Dial: [phone]

## 2024-05-07 NOTE — Patient Outreach (Signed)
 Complex Care Management   Visit Note  05/07/2024  Name:  Amber Acevedo MRN: [id number] DOB: [birth year]  Situation: Referral received for Complex Care Management related to Mental/Behavioral Health diagnosis GAD-insomnia symptoms I obtained verbal consent from Patient.  Visit completed with patient and VBCI LCSW  on the phone  Background:   Past Medical History:  Diagnosis Date   Arthritis    Diabetes mellitus without complication (HCC)    Hypertension    Thyroid  disease     Assessment: Patient Reported Symptoms:  Cognitive Cognitive Status: Able to follow simple commands, Normal speech and language skills, Alert and oriented to person, place, and time Cognitive/Intellectual Conditions Management [RPT]: None reported or documented in medical history or problem list   Health Maintenance Behaviors: Annual physical exam Healing Pattern: Average Health Facilitated by: Pain control, Rest  Neurological Neurological Review of Symptoms: No symptoms reported    Gastrointestinal Gastrointestinal Symptoms Reported: No symptoms reported      Genitourinary Genitourinary Symptoms Reported: Frequency    Integumentary Integumentary Symptoms Reported: No symptoms reported    Musculoskeletal Musculoskelatal Symptoms Reviewed: Back pain Additional Musculoskeletal Details: Wheelchair bound Musculoskeletal Management Strategies: Medication therapy Musculoskeletal Self-Management Outcome: 2 (bad)      Psychosocial Psychosocial Symptoms Reported: Depression - if selected complete PHQ 2-9, Anxiety - if selected complete GAD Additional Psychological Details: Patient has been going to [facility] for several years for psychiatry. She denies any current SI/HI and does report increased stress around her health management. Healthy coping skill education provided. Patient reports feeling overwhelming during our initial assesesment questions and prefers to approach her goals at her own pace. Patient will go  to [facility] this friday for psychiatry follow up as her sleep problems have increased. Patient will consider outpatient indiviudal therapy over the next two weeks. Behavioral Management Strategies: Community resources, Medication therapy, Adequate rest Behavioral Health Self-Management Outcome: 3 (uncertain) Major Change/Loss/Stressor/Fears (CP): Death of a loved one, Medical condition, self Techniques to Cope with Loss/Stress/Change: Medication, Withdraw        05/07/2024   12:40 PM  Depression screen PHQ 2/9  Decreased Interest 1  Down, Depressed, Hopeless 1  PHQ - 2 Score 2  Altered sleeping 3  Tired, decreased energy 3  Feeling bad or failure about yourself  2  Trouble concentrating 1  Moving slowly or fidgety/restless 0  Suicidal thoughts 0  PHQ-9 Score 11  Difficult doing work/chores Somewhat difficult      04/14/2024   10:22 AM 01/08/2024    2:29 PM  GAD 7 : Generalized Anxiety Score  Nervous, Anxious, on Edge 1 1  Control/stop worrying 1 1  Worry too much - different things 1 1  Trouble relaxing 1 1  Restless 1 1  Easily annoyed or irritable 0 0  Afraid - awful might happen 1 1  Total GAD 7 Score 6 6  Anxiety Difficulty Somewhat difficult Somewhat difficult    SDOH Screenings   Food Insecurity: No Food Insecurity (05/07/2024)  Housing: Low Risk  (05/07/2024)  Transportation Needs: Unmet Transportation Needs (04/29/2024)  Utilities: Not At Risk (05/07/2024)  Alcohol Screen: Low Risk  (04/14/2024)  Depression (PHQ2-9): High Risk (05/07/2024)  Financial Resource Strain: Low Risk  (04/14/2024)  Physical Activity: Inactive (04/14/2024)  Social Connections: Socially Isolated (04/14/2024)  Stress: Stress Concern Present (05/07/2024)  Tobacco Use: Medium Risk (04/04/2024)  Health Literacy: Adequate Health Literacy (04/14/2024)   There were no vitals filed for this visit.  Medications Reviewed Today  Reviewed by Merlynn Lyle CROME, LCSW (Social Worker) on 05/07/24 at 1237   Med List Status: <None>   Medication Order Taking? Sig Documenting Provider Last Dose Status Informant  aspirin EC 81 MG tablet [id number]  Take 81 mg by mouth daily. Swallow whole. [provider]  Active   benztropine (COGENTIN) 1 MG tablet [id number]  1 tablet at bedtime. [provider]  Active Self           Med Note LILIAN BRUNO JONELLE Austin Sep 10, 2016  2:40 PM) Received from: External Pharmacy  Cholecalciferol (VITAMIN D -3) 125 MCG (5000 UT) TABS [id number]  Take by mouth. [provider]  Active   citalopram  (CELEXA ) 40 MG tablet [id number]  Take 40 mg by mouth daily. [provider]  Active   citalopram  (CELEXA ) 40 MG tablet [id number]  Take 1 tablet (40 mg total) by mouth daily.   Active   levothyroxine  (SYNTHROID ) 75 MCG tablet [id number]  Take 1 tablet (75 mcg total) by mouth daily. [place] HERO, FNP  Active   levothyroxine  (SYNTHROID ) 75 MCG tablet [id number]  Take 1 tablet (75 mcg total) by mouth daily. [place] HERO, FNP  Active   risperiDONE  (RISPERDAL ) 2 MG tablet [id number]  Take 2 mg by mouth at bedtime. [provider]  Active   risperiDONE  (RISPERDAL ) 2 MG tablet [id number]  Take 1 tablet (2 mg total) by mouth at bedtime.   Active   risperiDONE  (RISPERDAL ) 2 MG tablet [id number]  Take 1 tablet (2 mg total) by mouth at bedtime.   Active             Recommendation:   PCP Follow-up Specialty provider follow-up [facility] on 05/09/24 Continue Current Plan of Care  Follow Up Plan:   Telephone follow-up in 1 month  Lyle Merlynn, [place], MSW, [employer] Licensed Clinical Social Worker [employer] [facility]   [facility] [email or web address] Direct Dial: [phone]

## 2024-05-09 ENCOUNTER — Other Ambulatory Visit (HOSPITAL_COMMUNITY): Payer: Self-pay

## 2024-05-09 ENCOUNTER — Other Ambulatory Visit: Payer: Self-pay

## 2024-05-09 MED ORDER — CITALOPRAM HYDROBROMIDE 40 MG PO TABS
40.0000 mg | ORAL_TABLET | Freq: Every day | ORAL | 5 refills | Status: AC
  Filled 2024-05-09: qty 30, 30d supply, fill #0
  Filled 2024-06-06: qty 30, 30d supply, fill #1
  Filled 2024-07-11: qty 30, 30d supply, fill #2
  Filled 2024-08-06: qty 30, 30d supply, fill #3
  Filled 2024-09-30: qty 30, 30d supply, fill #4

## 2024-05-09 MED ORDER — RISPERIDONE 2 MG PO TABS
2.0000 mg | ORAL_TABLET | Freq: Every day | ORAL | 5 refills | Status: AC
  Filled 2024-05-09: qty 30, 30d supply, fill #0
  Filled 2024-06-06: qty 30, 30d supply, fill #1
  Filled 2024-07-11: qty 30, 30d supply, fill #2
  Filled 2024-08-06: qty 30, 30d supply, fill #3
  Filled 2024-09-30: qty 30, 30d supply, fill #4

## 2024-05-09 MED ORDER — BENZTROPINE MESYLATE 1 MG PO TABS
1.0000 mg | ORAL_TABLET | Freq: Every evening | ORAL | 5 refills | Status: DC
  Filled 2024-05-09: qty 30, 30d supply, fill #0
  Filled 2024-06-06: qty 30, 30d supply, fill #1
  Filled 2024-07-06: qty 30, 30d supply, fill #2
  Filled 2024-08-06: qty 30, 30d supply, fill #3
  Filled 2024-09-01: qty 30, 30d supply, fill #4
  Filled 2024-09-30: qty 30, 30d supply, fill #5

## 2024-05-13 ENCOUNTER — Other Ambulatory Visit: Payer: Self-pay

## 2024-05-13 NOTE — Patient Instructions (Signed)

## 2024-05-13 NOTE — Patient Outreach (Signed)
 Complex Care Management   Visit Note  05/13/2024  Name:  Amber Acevedo MRN: [id number] DOB: Aug 05, 1966  Situation: Referral received for Complex Care Management related to SDOH Barriers:  PCS and Ramp I obtained verbal consent from Patient.  Visit completed with patient  on the phone  Background:   Past Medical History:  Diagnosis Date   Arthritis    Diabetes mellitus without complication (HCC)    Hypertension    Thyroid  disease     Assessment:  Patient reports provider requested an office visit for [facility] referral and patient plans to wait until the November 2025 appointment. Patient feels she can wait for PCS. Patient daughter agreed to purchase the threshold ramp and will obtain the measurements once she returns from out of town.  Patient does not have any upcoming appointments and will use the next couple of months to practice exiting the home safely with the aide of her daughter.   SDOH Interventions    Flowsheet Row Patient Outreach Telephone from 05/07/2024 in [place] POPULATION HEALTH DEPARTMENT Patient Outreach Telephone from 04/29/2024 in [place] POPULATION HEALTH DEPARTMENT Patient Outreach Telephone from 04/14/2024 in [place] POPULATION HEALTH DEPARTMENT Clinical Support from 04/04/2024 in [facility] Office Visit from 01/08/2024 in [facility] Clinical Support from 05/29/2022 in [facility]  SDOH Interventions        Food Insecurity Interventions Intervention Not Indicated Intervention Not Indicated Intervention Not Indicated Intervention Not Indicated -- Intervention Not Indicated  Housing Interventions Intervention Not Indicated Intervention Not Indicated Intervention Not Indicated Intervention Not Indicated -- Intervention Not Indicated  Transportation Interventions -- Other (Comment)  [Will use insurance Safe [employer] coverage] AMB Referral AMB Referral -- Intervention Not  Indicated  Utilities Interventions Intervention Not Indicated Intervention Not Indicated Intervention Not Indicated Intervention Not Indicated -- --  Alcohol Usage Interventions -- -- -- Intervention Not Indicated (Score <7) -- --  Depression Interventions/Treatment  Currently on Treatment, [facility] Provided -- Currently on Treatment, [facility] Provided  [employer] agreeable to consider therapy referral] Currently on Treatment, Counseling Currently on Treatment --  Financial Strain Interventions -- -- Intervention Not Indicated Intervention Not Indicated -- Intervention Not Indicated  Physical Activity Interventions -- -- Other (Comments)  [wheelchair bound//chronic bilateral hip pain// needs bilateral hip replacement surgery but needs to lose weight. working on that.] Other (Comments)  [wheelchair bound//chronic bilateral hip pain// needs bilateral hip replacement surgery but needs to lose weight. working on that.] -- Patient Refused  Stress Interventions [facility] Provided, Provide Counseling -- Provide Counseling, [facility] Provided Other (Comment)  [referral placed. patient aware and in agreement with treatment plan.] -- Intervention Not Indicated  Social Connections Interventions -- -- Patient Declined Patient Declined  [patient declined referral and offer for community resources] -- Intervention Not Indicated  Tobacco Interventions -- -- -- -- -- Intervention Not Indicated  Health Literacy Interventions -- -- Intervention Not Indicated Intervention Not Indicated -- --      Recommendation:   None  Follow Up Plan:   Patient has met all care management goals. Care Management case will be closed. Patient has been provided contact information should new needs arise.   Tillman Gardener, BSW [place]  [facility], [facility] Social Worker Direct Dial: [phone]  Fax: [phone] Website: [email or web address]

## 2024-05-19 ENCOUNTER — Other Ambulatory Visit: Payer: Self-pay | Admitting: Licensed Clinical Social Worker

## 2024-05-19 NOTE — Patient Outreach (Signed)
 Complex Care Management   Visit Note  05/19/2024  Name:  Amber Acevedo MRN: [id number] DOB: 19-Feb-1966  Situation: Referral received for Complex Care Management related to Mental/Behavioral Health diagnosis depression/anxiety I obtained verbal consent from Patient.  Visit completed with VBCI LCSW  on the phone  Background:   Past Medical History:  Diagnosis Date   Arthritis    Diabetes mellitus without complication (HCC)    Hypertension    Thyroid  disease     Assessment: Patient Reported Symptoms:  Cognitive Cognitive Status: Able to follow simple commands, Normal speech and language skills, Alert and oriented to person, place, and time Cognitive/Intellectual Conditions Management [RPT]: None reported or documented in medical history or problem list   Health Maintenance Behaviors: Annual physical exam Healing Pattern: Average Health Facilitated by: Rest, Stress management  Neurological Neurological Review of Symptoms: No symptoms reported Neurological Management Strategies: Routine screening Neurological Self-Management Outcome: 4 (good)  HEENT HEENT Symptoms Reported: No symptoms reported HEENT Management Strategies: Routine screening HEENT Self-Management Outcome: 4 (good)    Cardiovascular Cardiovascular Symptoms Reported: No symptoms reported    Psychosocial Psychosocial Symptoms Reported: Depression - if selected complete PHQ 2-9 Additional Psychological Details: Patient has been going to [facility] for several years for psychiatry. She denies any current SI/HI and does report increased stress around her health management. Healthy coping skill education provided. Patient reports feeling overwhelming during our initial assesesment questions and prefers to approach her goals at her own pace. Patient went [facility] or psychiatry follow up in July for her sleep problems which have increased. Patient will consider outpatient indiviudal therapy over the next two weeks. Behavioral  Management Strategies: Medication therapy Behavioral Health Self-Management Outcome: 4 (good)   Quality of Family Relationships: involved Do you feel physically threatened by others?: No      05/19/2024    2:28 PM  Depression screen PHQ 2/9  Decreased Interest 1  Down, Depressed, Hopeless 2  PHQ - 2 Score 3  Altered sleeping 3  Tired, decreased energy 3  Change in appetite 2  Feeling bad or failure about yourself  3  Trouble concentrating 1  Moving slowly or fidgety/restless 0  PHQ-9 Score 15  Difficult doing work/chores Somewhat difficult    There were no vitals filed for this visit.  Medications Reviewed Today     Reviewed by Merlynn Lyle CROME, LCSW (Social Worker) on 05/19/24 at 1427  Med List Status: <None>   Medication Order Taking? Sig Documenting Provider Last Dose Status Informant  aspirin EC 81 MG tablet [id number]  Take 81 mg by mouth daily. Swallow whole. [provider]  Active   benztropine  (COGENTIN ) 1 MG tablet [id number]  1 tablet at bedtime. [provider]  Active Self           Med Note LILIAN BRUNO JONELLE [place] Sep 10, 2016  2:40 PM) Received from: External Pharmacy  benztropine  (COGENTIN ) 1 MG tablet [id number]  Take 1 tablet (1 mg total) by mouth at bedtime.   Active   Cholecalciferol (VITAMIN D -3) 125 MCG (5000 UT) TABS [id number]  Take by mouth. [provider]  Active   citalopram  (CELEXA ) 40 MG tablet [id number]  Take 40 mg by mouth daily. [provider]  Active   citalopram  (CELEXA ) 40 MG tablet [id number]  Take 1 tablet (40 mg total) by mouth daily.   Active   citalopram  (CELEXA ) 40 MG tablet [id number]  Take 1 tablet (40 mg total) by mouth daily.  Active   levothyroxine  (SYNTHROID ) 75 MCG tablet [id number]  Take 1 tablet (75 mcg total) by mouth daily. [place] HERO, FNP  Active   levothyroxine  (SYNTHROID ) 75 MCG tablet [id number]  Take 1 tablet (75 mcg total) by mouth daily. [place] HERO, FNP  Active   risperiDONE   (RISPERDAL ) 2 MG tablet [id number]  Take 2 mg by mouth at bedtime. [provider]  Active   risperiDONE  (RISPERDAL ) 2 MG tablet [id number]  Take 1 tablet (2 mg total) by mouth at bedtime.   Active   risperiDONE  (RISPERDAL ) 2 MG tablet [id number]  Take 1 tablet (2 mg total) by mouth at bedtime.   Active   risperiDONE  (RISPERDAL ) 2 MG tablet [id number]  Take 1 tablet (2 mg total) by mouth at bedtime.   Active            SDOH Screenings   Food Insecurity: No Food Insecurity (05/07/2024)  Housing: Low Risk  (05/07/2024)  Transportation Needs: Unmet Transportation Needs (04/29/2024)  Utilities: Not At Risk (05/07/2024)  Alcohol Screen: Low Risk  (04/14/2024)  Depression (PHQ2-9): High Risk (05/19/2024)  Financial Resource Strain: Low Risk  (04/14/2024)  Physical Activity: Inactive (04/14/2024)  Social Connections: Socially Isolated (04/14/2024)  Stress: Stress Concern Present (05/19/2024)  Tobacco Use: Medium Risk (04/04/2024)  Health Literacy: Adequate Health Literacy (04/14/2024)   Recommendation:   PCP Follow-up Continue Current Plan of Care  Follow Up Plan:   Telephone follow-up in 1 month  Lyle Rung, BSW, MSW, LCSW Licensed Clinical Social Worker [employer]   [facility] [email or web address] Direct Dial: [phone]

## 2024-05-19 NOTE — Patient Instructions (Signed)
 Visit Information  Thank you for taking time to visit with me today. Please don't hesitate to contact me if I can be of assistance to you before our next scheduled appointment.  Our next appointment is by telephone on 06/09/24 at 330 pm Please call the care guide team at [phone] if you need to cancel or reschedule your appointment.   Following is a copy of your care plan:   Goals Addressed             This Visit's Progress    LCSW-VBCI Social Work Care Plan       Problems:   ADL IADL limitations, Inability to perform ADL's independently, Inability to perform IADL's independently, Mental Health Concerns , Social Isolation, and Transportation, Disease Management support and education needs related to Depression: depressed mood, feelings of worthlessness, guilt, and loss of energy/fatigue and Grief, and Social Connections  CSW Clinical Goal(s):   Over the next 90 days the Patient will attend all scheduled medical appointments as evidenced by patient report and care team review of appointment completion in electronic MEDICAL RECORD NUMBERby VBCI LCSW demonstrate a reduction in symptoms related to Anxiety with Excessive Worry, Grief  explore community resource options for unmet needs related to Depression   and Transportation.  Interventions:  Social Determinants of Health in Patient with Grief Stress at managing health conditions: SDOH assessments completed: Alcohol/Substance Use, Depression  , Financial Strain , Food Insecurity , Housing , Intimate Partner Violence, Physical Activity, Social Connections, Stress, Tobacco Use, and Transportation Evaluation of current treatment plan related to unmet needs Transportation resources: VBCI BSW appointment made to assist with specific wheelchair bound transportation barriers that patient is experiencing in her area Mental Health:  Evaluation of current treatment plan related to Depression: anxiety and Grief Active listening / Reflection  utilized Mining engineer reviewed Financial risk analyst / information provided Depression screen reviewed Emotional Support Provided Mindfulness or Relaxation training provided Motivational Interviewing employed Participation in counseling encourage : patient wishes to take her time in considering this resource support PHQ2/PHQ9 completed Problem Solving /Task Center strategies reviewed Provided general psycho-education for mental health needs Quality of sleep assessed & Sleep Hygiene techniques promoted Reviewed mental health medications and discussed importance of compliance: [facility] manages her psychiatric medications but patient is open to transitioning her care to another practice if needed. She has a follow up this Friday and will make decision after this appointment regarding changing her [facility] providers or increasing her level of care. 05/19/24 update, patient successfully attended psychiatry follow up appointment and gained her psychiatric medications. She is agreeable to [facility] referral for therapy only next month. No urgent concerns reported. Crisis support resource education was physically wrote down by patient during outreach today.  Solution-Focued Strategies employed: Suicidal Ideation/Homicidal Ideation assessed: No SI/HI  Patient Goals/Self-Care Activities:  Connect with provider for ongoing mental health treatment.   Increase coping skills, healthy habits, self-management skills, and stress reduction  Plan:   The care management team will reach out to the patient again over the next 30 days.        Please call the Suicide and Crisis Lifeline: 988 call the [facility]: [phone] if you are experiencing a Mental Health or Behavioral Health Crisis or need someone to talk to.  Patient verbalizes understanding of instructions and care plan provided today and agrees to view in MyChart. Active MyChart status and patient understanding of how to  access instructions and care plan via MyChart confirmed with  patient.     Lyle Rung, BSW, MSW, LCSW Licensed Clinical Social Worker [employer]   [facility] [email or web address] Direct Dial: [phone]

## 2024-06-06 ENCOUNTER — Encounter: Payer: Self-pay | Admitting: Radiology

## 2024-06-06 ENCOUNTER — Other Ambulatory Visit (HOSPITAL_COMMUNITY): Payer: Self-pay

## 2024-06-09 ENCOUNTER — Other Ambulatory Visit: Payer: Self-pay | Admitting: Licensed Clinical Social Worker

## 2024-06-09 DIAGNOSIS — F333 Major depressive disorder, recurrent, severe with psychotic symptoms: Secondary | ICD-10-CM

## 2024-06-10 NOTE — Patient Outreach (Signed)
 Complex Care Management   Visit Note  06/10/2024  Name:  Amber Acevedo MRN: [id number] DOB: [birth year]  Situation: Referral received for Complex Care Management related to Mental/Behavioral Health diagnosis MDD I obtained verbal consent from Patient.  Visit completed with Patient  on the phone  Background:   Past Medical History:  Diagnosis Date   Arthritis    Diabetes mellitus without complication (HCC)    Hypertension    Thyroid  disease     Assessment: Patient Reported Symptoms:  Cognitive Cognitive Status: Able to follow simple commands, Alert and oriented to person, place, and time Cognitive/Intellectual Conditions Management [RPT]: None reported or documented in medical history or problem list   Health Maintenance Behaviors: Annual physical exam Healing Pattern: Average Health Facilitated by: Rest, Stress management  Neurological Neurological Review of Symptoms: No symptoms reported Neurological Management Strategies: Routine screening  HEENT HEENT Symptoms Reported: No symptoms reported      Cardiovascular Cardiovascular Symptoms Reported: No symptoms reported    Respiratory Respiratory Symptoms Reported: No symptoms reported    Endocrine Endocrine Symptoms Reported: No symptoms reported    Gastrointestinal Gastrointestinal Symptoms Reported: No symptoms reported      Genitourinary Genitourinary Symptoms Reported: Frequency Genitourinary Management Strategies: Coping strategies  Integumentary Integumentary Symptoms Reported: No symptoms reported    Musculoskeletal Musculoskelatal Symptoms Reviewed: Back pain Musculoskeletal Management Strategies: Medical device, Medication therapy      Psychosocial Psychosocial Symptoms Reported: Depression - if selected complete PHQ 2-9 Additional Psychological Details: Outpatient therapy referral placed Behavioral Management Strategies: Medication therapy Behavioral Health Self-Management Outcome: 3 (uncertain)   Quality  of Family Relationships: involved, helpful Do you feel physically threatened by others?: No    06/10/2024    PHQ2-9 Depression Screening   Little interest or pleasure in doing things Several days  Feeling down, depressed, or hopeless Several days  PHQ-2 - Total Score 2  Trouble falling or staying asleep, or sleeping too much    Feeling tired or having little energy More than half the days  Poor appetite or overeating  Several days  Feeling bad about yourself - or that you are a failure or have let yourself or your family down Several days  Trouble concentrating on things, such as reading the newspaper or watching television Several days  Moving or speaking so slowly that other people could have noticed.  Or the opposite - being so fidgety or restless that you have been moving around a lot more than usual Not at all  Thoughts that you would be better off dead, or hurting yourself in some way Not at all  PHQ2-9 Total Score    If you checked off any problems, how difficult have these problems made it for you to do your work, take care of things at home, or get along with other people Somewhat difficult  Depression Interventions/Treatment Medication, Counseling, Currently on Treatment    There were no vitals filed for this visit.  Medications Reviewed Today     Reviewed by Merlynn Lyle CROME, LCSW (Social Worker) on 06/09/24 at 1535  Med List Status: <None>   Medication Order Taking? Sig Documenting Provider Last Dose Status Informant  aspirin EC 81 MG tablet [id number]  Take 81 mg by mouth daily. Swallow whole. [provider]  Active   benztropine  (COGENTIN ) 1 MG tablet [id number]  1 tablet at bedtime. [provider]  Active Self           Med Note LILIAN, BRUNO JONELLE Repress  Sep 10, 2016  2:40 PM) Received from: External Pharmacy  benztropine  (COGENTIN ) 1 MG tablet [id number]  Take 1 tablet (1 mg total) by mouth at bedtime.   Active   Cholecalciferol (VITAMIN D -3) 125 MCG  (5000 UT) TABS [id number]  Take by mouth. [provider]  Active   citalopram  (CELEXA ) 40 MG tablet [id number]  Take 40 mg by mouth daily. [provider]  Active   citalopram  (CELEXA ) 40 MG tablet [id number]  Take 1 tablet (40 mg total) by mouth daily.   Active   citalopram  (CELEXA ) 40 MG tablet [id number]  Take 1 tablet (40 mg total) by mouth daily.   Active   levothyroxine  (SYNTHROID ) 75 MCG tablet [id number]  Take 1 tablet (75 mcg total) by mouth daily. [place] HERO, FNP  Active   levothyroxine  (SYNTHROID ) 75 MCG tablet [id number]  Take 1 tablet (75 mcg total) by mouth daily. [place] HERO, FNP  Active   risperiDONE  (RISPERDAL ) 2 MG tablet [id number]  Take 2 mg by mouth at bedtime. [provider]  Active   risperiDONE  (RISPERDAL ) 2 MG tablet [id number]  Take 1 tablet (2 mg total) by mouth at bedtime.   Active   risperiDONE  (RISPERDAL ) 2 MG tablet [id number]  Take 1 tablet (2 mg total) by mouth at bedtime.   Active   risperiDONE  (RISPERDAL ) 2 MG tablet [id number]  Take 1 tablet (2 mg total) by mouth at bedtime.   Active             Recommendation:   PCP Follow-up Referral to: [facility] -therapy referral  Follow Up Plan:   Telephone follow-up in 1 month  Lyle Rung, BSW, MSW, LCSW Licensed Clinical Social Worker [employer]   [facility] [email or web address] Direct Dial: [phone]

## 2024-06-10 NOTE — Patient Instructions (Signed)
 Visit Information  Thank you for taking time to visit with me today. Please don't hesitate to contact me if I can be of assistance to you before our next scheduled appointment.  Our next appointment is by telephone on 06/25/24 at 330 Please call the care guide team at [phone] if you need to cancel or reschedule your appointment.   Following is a copy of your care plan:   Goals Addressed             This Visit's Progress    LCSW-VBCI Social Work Care Plan       Problems:   ADL IADL limitations, Inability to perform ADL's independently, Inability to perform IADL's independently, Mental Health Concerns , Social Isolation, and Transportation, Disease Management support and education needs related to Depression: depressed mood, feelings of worthlessness, guilt, and loss of energy/fatigue and Grief, and Social Connections  CSW Clinical Goal(s):   Over the next 90 days the Patient will attend all scheduled medical appointments as evidenced by patient report and care team review of appointment completion in electronic MEDICAL RECORD NUMBERby VBCI LCSW demonstrate a reduction in symptoms related to Anxiety with Excessive Worry, Grief  explore community resource options for unmet needs related to Depression   and Transportation.  Interventions:  Social Determinants of Health in Patient with Grief Stress at managing health conditions: SDOH assessments completed: Alcohol/Substance Use, Depression  , Financial Strain , Food Insecurity , Housing , Intimate Partner Violence, Physical Activity, Social Connections, Stress, Tobacco Use, and Transportation Evaluation of current treatment plan related to unmet needs Transportation resources: VBCI BSW appointment made to assist with specific wheelchair bound transportation barriers that patient is experiencing in her area Mental Health:  Evaluation of current treatment plan related to Depression: anxiety and Grief Active listening / Reflection  utilized Mining engineer reviewed Financial risk analyst / information provided Depression screen reviewed Emotional Support Provided Mindfulness or Relaxation training provided Motivational Interviewing employed Participation in counseling encourage : patient wishes to take her time in considering this resource support PHQ2/PHQ9 completed Problem Solving /Task Center strategies reviewed Provided general psycho-education for mental health needs Quality of sleep assessed & Sleep Hygiene techniques promoted Reviewed mental health medications and discussed importance of compliance: [facility] manages her psychiatric medications but patient is open to transitioning her care to another practice if needed. She has a follow up this Friday and will make decision after this appointment regarding changing her [facility] providers or increasing her level of care. 05/19/24 update, patient successfully attended psychiatry follow up appointment and gained her psychiatric medications. She is agreeable to [facility] referral for therapy only next month. No urgent concerns reported. Crisis support resource education was physically wrote down by patient during outreach today. 06/09/24- Patient is now ready for virtual therapy referral. Referral placed successfully today for [facility] for telephonic counseling. Patient continues to prefer to wait for PCS until after her in person visit in November of 2025.  Solution-Focued Strategies employed: Suicidal Ideation/Homicidal Ideation assessed: No SI/HI  Patient Goals/Self-Care Activities:  Connect with provider for ongoing mental health treatment.   Increase coping skills, healthy habits, self-management skills, and stress reduction  Plan:   The care management team will reach out to the patient again over the next 30 days.        Please call the Suicide and Crisis Lifeline: 988 go to [facility] Urgent [facility] [address],  [place] [phone]) call the [facility]: [phone] if  you are experiencing a Mental Health or Behavioral Health Crisis or need someone to talk to.  Patient verbalizes understanding of instructions and care plan provided today and agrees to view in MyChart. Active MyChart status and patient understanding of how to access instructions and care plan via MyChart confirmed with patient.     Lyle Rung, BSW, MSW, LCSW Licensed Clinical Social Worker [employer]   [facility] [email or web address] Direct Dial: [phone]

## 2024-06-25 ENCOUNTER — Telehealth: Payer: Self-pay | Admitting: Licensed Clinical Social Worker

## 2024-06-26 ENCOUNTER — Other Ambulatory Visit: Payer: Self-pay | Admitting: Licensed Clinical Social Worker

## 2024-06-26 NOTE — Progress Notes (Signed)
 Thank you for letting her know we will get to her as soon as we can

## 2024-06-26 NOTE — Patient Instructions (Signed)
 Visit Information  Thank you for taking time to visit with me today. Please don't hesitate to contact me if I can be of assistance to you before our next scheduled appointment.  Our next appointment is by telephone on 07/10/24 at 2 Please call the care guide team at [phone] if you need to cancel or reschedule your appointment.   Following is a copy of your care plan:   Goals Addressed             This Visit's Progress    LCSW-VBCI Social Work Care Plan       Problems:   ADL IADL limitations, Inability to perform ADL's independently, Inability to perform IADL's independently, Mental Health Concerns , Social Isolation, and Transportation, Disease Management support and education needs related to Depression: depressed mood, feelings of worthlessness, guilt, and loss of energy/fatigue and Grief, and Social Connections  CSW Clinical Goal(s):   Over the next 90 days the Patient will attend all scheduled medical appointments as evidenced by patient report and care team review of appointment completion in electronic MEDICAL RECORD NUMBERby VBCI LCSW demonstrate a reduction in symptoms related to Anxiety with Excessive Worry, Grief  explore community resource options for unmet needs related to Depression   and Transportation.  Interventions:  Social Determinants of Health in Patient with Grief Stress at managing health conditions: SDOH assessments completed: Alcohol/Substance Use, Depression  , Financial Strain , Food Insecurity , Housing , Intimate Partner Violence, Physical Activity, Social Connections, Stress, Tobacco Use, and Transportation Evaluation of current treatment plan related to unmet needs Transportation resources: VBCI BSW appointment made to assist with specific wheelchair bound transportation barriers that patient is experiencing in her area Mental Health:  Evaluation of current treatment plan related to Depression: anxiety and Grief Active listening / Reflection  utilized Mining engineer reviewed Financial risk analyst / information provided Depression screen reviewed Emotional Support Provided Mindfulness or Relaxation training provided Motivational Interviewing employed Participation in counseling encourage : patient wishes to take her time in considering this resource support PHQ2/PHQ9 completed Problem Solving /Task Center strategies reviewed Provided general psycho-education for mental health needs Quality of sleep assessed & Sleep Hygiene techniques promoted Reviewed mental health medications and discussed importance of compliance: [facility] manages her psychiatric medications but patient is open to transitioning her care to another practice if needed. She has a follow up this Friday and will make decision after this appointment regarding changing her [facility] providers or increasing her level of care. 05/19/24 update, patient successfully attended psychiatry follow up appointment and gained her psychiatric medications. She is agreeable to [facility] referral for therapy only next month. No urgent concerns reported. Crisis support resource education was physically wrote down by patient during outreach today. 06/09/24- Patient is now ready for virtual therapy referral. Referral placed successfully today for [facility] for telephonic counseling. Patient continues to prefer to wait for PCS until after her in person visit in November of 2025. Patient reports that she has not been contacted by [facility] [place] yet and was encouraged to contact practice to get scheduled. VBCI LCSW sent message to [facility] inquiring about referral on 9/11/2.5 Solution-Focued Strategies employed: Suicidal Ideation/Homicidal Ideation assessed: No SI/HI  Patient Goals/Self-Care Activities:  Connect with provider for ongoing mental health treatment.   Increase coping skills, healthy habits, self-management skills, and stress reduction  Plan:   The  care management team will reach out to the patient again over the next 30 days.  Please call the Suicide and Crisis Lifeline: 988 call the [place]  National Suicide Prevention Lifeline: 540 597 0888 or TTY: (479) 592-2925 TTY [phone]) to talk to a trained counselor call 1-800-273-TALK (toll free, 24 hour hotline) go to [facility] [address] [place] [phone]) call the [facility] Crisis Line: [phone] call 911 if you are experiencing a Mental Health or Behavioral Health Crisis or need someone to talk to.  Patient verbalizes understanding of instructions and care plan provided today and agrees to view in MyChart. Active MyChart status and patient understanding of how to access instructions and care plan via MyChart confirmed with patient.     Lyle Rung, BSW, MSW, LCSW Licensed Clinical Social Worker [employer]   [email or web address] Direct Dial: [phone]

## 2024-06-26 NOTE — Patient Outreach (Signed)
 Complex Care Management   Visit Note  06/26/2024  Name:  Amber Acevedo MRN: [id number] DOB: [birth year]  Situation: Referral received for Complex Care Management related to Mental/Behavioral Health diagnosis depression/grief. I obtained verbal consent from Patient.  Visit completed with Patient  on the phone  Background:   Past Medical History:  Diagnosis Date   Arthritis    Diabetes mellitus without complication (HCC)    Hypertension    Thyroid  disease     Assessment: Patient Reported Symptoms:  Cognitive Cognitive Status: Able to follow simple commands, Alert and oriented to person, place, and time Cognitive/Intellectual Conditions Management [RPT]: None reported or documented in medical history or problem list   Health Maintenance Behaviors: Annual physical exam Healing Pattern: Average Health Facilitated by: Stress management, Rest (BH referral placed on 06/09/24)  Neurological Neurological Review of Symptoms: No symptoms reported Neurological Management Strategies: Routine screening Neurological Self-Management Outcome: 4 (good)  HEENT HEENT Symptoms Reported: No symptoms reported      Cardiovascular Cardiovascular Symptoms Reported: No symptoms reported Does patient have uncontrolled Hypertension?: No Cardiovascular Management Strategies: Routine screening, Adequate rest Cardiovascular Self-Management Outcome: 4 (good)  Respiratory Respiratory Symptoms Reported: No symptoms reported    Psychosocial Psychosocial Symptoms Reported: Sadness - if selected complete PHQ 2-9 Additional Psychological Details: Outpatient BH referral placed on 06/09/24 but pt has not heard from practice yet, VBCI LCSW will CC [facility] outpatient services [facility]) Behavioral Management Strategies: Medication therapy Behavioral Health Self-Management Outcome: 3 (uncertain) Major Change/Loss/Stressor/Fears (CP): Medical condition, self, Death of a loved one Techniques to Cope with  Loss/Stress/Change: Withdraw, Medication Quality of Family Relationships: involved, supportive Do you feel physically threatened by others?: No    06/26/2024    PHQ2-9 Depression Screening   Little interest or pleasure in doing things Several days  Feeling down, depressed, or hopeless Several days  PHQ-2 - Total Score 2  Trouble falling or staying asleep, or sleeping too much Nearly every day  Feeling tired or having little energy More than half the days  Poor appetite or overeating  Several days  Feeling bad about yourself - or that you are a failure or have let yourself or your family down More than half the days  Trouble concentrating on things, such as reading the newspaper or watching television Not at all  Moving or speaking so slowly that other people could have noticed.  Or the opposite - being so fidgety or restless that you have been moving around a lot more than usual Not at all  Thoughts that you would be better off dead, or hurting yourself in some way Not at all  PHQ2-9 Total Score 10  If you checked off any problems, how difficult have these problems made it for you to do your work, take care of things at home, or get along with other people Somewhat difficult  Depression Interventions/Treatment Medication, Currently on Treatment    There were no vitals filed for this visit.  Medications Reviewed Today     Reviewed by Merlynn Lyle CROME, LCSW (Social Worker) on 06/26/24 at 1202  Med List Status: <None>   Medication Order Taking? Sig Documenting Provider Last Dose Status Informant  aspirin EC 81 MG tablet [id number]  Take 81 mg by mouth daily. Swallow whole. [provider]  Active   benztropine  (COGENTIN ) 1 MG tablet [id number]  1 tablet at bedtime. [provider]  Active Self           Med Note LILIAN, KRISTA  JONELLE Repress Sep 10, 2016  2:40 PM) Received from: External Pharmacy  benztropine  (COGENTIN ) 1 MG tablet [id number]  Take 1 tablet (1 mg total) by mouth  at bedtime.   Active   Cholecalciferol (VITAMIN D -3) 125 MCG (5000 UT) TABS [id number]  Take by mouth. [provider]  Active   citalopram  (CELEXA ) 40 MG tablet [id number]  Take 40 mg by mouth daily. [provider]  Active   citalopram  (CELEXA ) 40 MG tablet [id number]  Take 1 tablet (40 mg total) by mouth daily.   Active   citalopram  (CELEXA ) 40 MG tablet [id number]  Take 1 tablet (40 mg total) by mouth daily.   Active   levothyroxine  (SYNTHROID ) 75 MCG tablet [id number]  Take 1 tablet (75 mcg total) by mouth daily. [place] HERO, FNP  Active   levothyroxine  (SYNTHROID ) 75 MCG tablet [id number]  Take 1 tablet (75 mcg total) by mouth daily. [place] HERO, FNP  Active   risperiDONE  (RISPERDAL ) 2 MG tablet [id number]  Take 2 mg by mouth at bedtime. [provider]  Active   risperiDONE  (RISPERDAL ) 2 MG tablet [id number]  Take 1 tablet (2 mg total) by mouth at bedtime.   Active   risperiDONE  (RISPERDAL ) 2 MG tablet [id number]  Take 1 tablet (2 mg total) by mouth at bedtime.   Active   risperiDONE  (RISPERDAL ) 2 MG tablet [id number]  Take 1 tablet (2 mg total) by mouth at bedtime.   Active             Recommendation:   PCP Follow-up Continue Current Plan of Care  Follow Up Plan:   Telephone follow-up in 1 month  Lyle Rung, BSW, MSW, LCSW Licensed Clinical Social Worker [employer]   [facility] [email or web address] Direct Dial: [phone]

## 2024-07-06 ENCOUNTER — Other Ambulatory Visit: Payer: Self-pay | Admitting: Family Medicine

## 2024-07-06 ENCOUNTER — Other Ambulatory Visit (HOSPITAL_COMMUNITY): Payer: Self-pay

## 2024-07-07 ENCOUNTER — Other Ambulatory Visit: Payer: Self-pay

## 2024-07-07 ENCOUNTER — Other Ambulatory Visit (HOSPITAL_COMMUNITY): Payer: Self-pay

## 2024-07-07 MED ORDER — LEVOTHYROXINE SODIUM 75 MCG PO TABS
75.0000 ug | ORAL_TABLET | Freq: Every day | ORAL | 0 refills | Status: DC
  Filled 2024-07-07: qty 90, 90d supply, fill #0

## 2024-07-10 ENCOUNTER — Other Ambulatory Visit: Payer: Self-pay | Admitting: Licensed Clinical Social Worker

## 2024-07-10 NOTE — Patient Outreach (Signed)
 Complex Care Management   Visit Note  07/12/2024  Name:  Amber Acevedo MRN: [id number] DOB: [birth year]  Situation: Referral received for Complex Care Management related to Mental/Behavioral Health diagnosis MDD. I obtained verbal consent from Patient.  Visit completed with Patient  on the phone  Background:   Past Medical History:  Diagnosis Date   Arthritis    Diabetes mellitus without complication (HCC)    Hypertension    Thyroid  disease     Assessment: Patient Reported Symptoms:  Cognitive Cognitive Status: Able to follow simple commands, Alert and oriented to person, place, and time Cognitive/Intellectual Conditions Management [RPT]: None reported or documented in medical history or problem list   Health Maintenance Behaviors: Annual physical exam Health Facilitated by: Rest, Stress management  Neurological Neurological Review of Symptoms: No symptoms reported Neurological Management Strategies: Routine screening Neurological Self-Management Outcome: 4 (good)  Psychosocial Psychosocial Symptoms Reported: Sadness - if selected complete PHQ 2-9 Additional Psychological Details: Outpatient BH referral placed on 06/09/24 but pt has not heard from practice yet, VBCI LCSW will CC [facility] outpatient services [facility]) Behavioral Management Strategies: Medication therapy, Coping strategies, Adequate rest Behavioral Health Self-Management Outcome: 3 (uncertain) Major Change/Loss/Stressor/Fears (CP): Medical condition, self Techniques to Cope with Loss/Stress/Change: Medication, Withdraw Quality of Family Relationships: involved, helpful Do you feel physically threatened by others?: No    07/12/2024    PHQ2-9 Depression Screening   Little interest or pleasure in doing things    Feeling down, depressed, or hopeless    PHQ-2 - Total Score    Trouble falling or staying asleep, or sleeping too much    Feeling tired or having little energy    Poor appetite or overeating      Feeling bad about yourself - or that you are a failure or have let yourself or your family down    Trouble concentrating on things, such as reading the newspaper or watching television    Moving or speaking so slowly that other people could have noticed.  Or the opposite - being so fidgety or restless that you have been moving around a lot more than usual    Thoughts that you would be better off dead, or hurting yourself in some way    PHQ2-9 Total Score    If you checked off any problems, how difficult have these problems made it for you to do your work, take care of things at home, or get along with other people    Depression Interventions/Treatment      There were no vitals filed for this visit.  Medications Reviewed Today     Reviewed by Merlynn Lyle CROME, LCSW (Social Worker) on 07/12/24 at [phone]  Med List Status: <None>   Medication Order Taking? Sig Documenting Provider Last Dose Status Informant  aspirin EC 81 MG tablet [id number]  Take 81 mg by mouth daily. Swallow whole. [provider]  Active   benztropine  (COGENTIN ) 1 MG tablet [id number]  1 tablet at bedtime. [provider]  Active Self           Med Note LILIAN BRUNO JONELLE [place] Sep 10, 2016  2:40 PM) Received from: External Pharmacy  benztropine  (COGENTIN ) 1 MG tablet [id number]  Take 1 tablet (1 mg total) by mouth at bedtime.   Active   Cholecalciferol (VITAMIN D -3) 125 MCG (5000 UT) TABS [id number]  Take by mouth. [provider]  Active   citalopram  (CELEXA ) 40 MG tablet [id number]  Take 40 mg  by mouth daily. [provider]  Active   citalopram  (CELEXA ) 40 MG tablet [id number]  Take 1 tablet (40 mg total) by mouth daily.   Active   citalopram  (CELEXA ) 40 MG tablet [id number]  Take 1 tablet (40 mg total) by mouth daily.   Active   levothyroxine  (SYNTHROID ) 75 MCG tablet [id number]  Take 1 tablet (75 mcg total) by mouth daily. [place] HERO, FNP  Active   risperiDONE  (RISPERDAL ) 2 MG tablet  [id number]  Take 2 mg by mouth at bedtime. [provider]  Active   risperiDONE  (RISPERDAL ) 2 MG tablet [id number]  Take 1 tablet (2 mg total) by mouth at bedtime.   Active   risperiDONE  (RISPERDAL ) 2 MG tablet [id number]  Take 1 tablet (2 mg total) by mouth at bedtime.   Active   risperiDONE  (RISPERDAL ) 2 MG tablet [id number]  Take 1 tablet (2 mg total) by mouth at bedtime.   Active             Recommendation:   PCP Follow-up  Follow Up Plan:   Telephone follow-up in 1 month  Lyle Rung, BSW, MSW, LCSW Licensed Clinical Social Worker [employer]   [facility] [email or web address] Direct Dial: [phone]

## 2024-07-11 ENCOUNTER — Other Ambulatory Visit (HOSPITAL_COMMUNITY): Payer: Self-pay

## 2024-07-14 NOTE — Patient Instructions (Signed)
 Visit Information  Thank you for taking time to visit with me today. Please don't hesitate to contact me if I can be of assistance to you before our next scheduled appointment.  Your next care management appointment is by telephone on 07/24/24 at 2pm  Telephone follow-up in 1 month  Please call the care guide team at [phone] if you need to cancel, schedule, or reschedule an appointment.   Please call the Suicide and Crisis Lifeline: 988 go to [facility] [address], [place] [phone]) call the [facility] Crisis Line: [phone] call 911 if you are experiencing a Mental Health or Behavioral Health Crisis or need someone to talk to.  Lyle Rung, BSW, MSW, LCSW Licensed Clinical Social Worker [employer]   [facility] [email or web address] Direct Dial: [phone]

## 2024-07-23 ENCOUNTER — Other Ambulatory Visit: Payer: Self-pay | Admitting: Licensed Clinical Social Worker

## 2024-07-24 ENCOUNTER — Other Ambulatory Visit: Payer: Self-pay | Admitting: Licensed Clinical Social Worker

## 2024-07-24 NOTE — Patient Outreach (Signed)
 Complex Care Management   Visit Note  07/25/2024  Name:  Amber Acevedo MRN: [id number] DOB: 11-17-1965  Situation: Referral received for Complex Care Management related to Mental/Behavioral Health diagnosis depression/anxiety. I obtained verbal consent from Patient.  Visit completed with Patient  on the phone  Background:   Past Medical History:  Diagnosis Date   Arthritis    Diabetes mellitus without complication (HCC)    Hypertension    Thyroid  disease     Assessment: Patient Reported Symptoms:  Cognitive Cognitive Status: Able to follow simple commands, Alert and oriented to person, place, and time Cognitive/Intellectual Conditions Management [RPT]: None reported or documented in medical history or problem list   Health Maintenance Behaviors: Annual physical exam Healing Pattern: Average Health Facilitated by: Rest, Stress management  Neurological Neurological Review of Symptoms: No symptoms reported Neurological Management Strategies: Routine screening Neurological Self-Management Outcome: 4 (good)  Psychosocial Psychosocial Symptoms Reported: Sadness - if selected complete PHQ 2-9 Additional Psychological Details: Outpatient BH referral placed on 06/09/24, upcoming therapy session confirmed with pt today on 07/27/24. Behavioral Management Strategies: Coping strategies, Adequate rest, Medication therapy Behavioral Health Self-Management Outcome: 3 (uncertain) Major Change/Loss/Stressor/Fears (CP): Medical condition, self Techniques to Cope with Loss/Stress/Change: Medication Quality of Family Relationships: helpful Do you feel physically threatened by others?: No    07/27/2024    PHQ2-9 Depression Screening   Little interest or pleasure in doing things    Feeling down, depressed, or hopeless    PHQ-2 - Total Score    Trouble falling or staying asleep, or sleeping too much    Feeling tired or having little energy    Poor appetite or overeating     Feeling bad about  yourself - or that you are a failure or have let yourself or your family down    Trouble concentrating on things, such as reading the newspaper or watching television    Moving or speaking so slowly that other people could have noticed.  Or the opposite - being so fidgety or restless that you have been moving around a lot more than usual    Thoughts that you would be better off dead, or hurting yourself in some way    PHQ2-9 Total Score    If you checked off any problems, how difficult have these problems made it for you to do your work, take care of things at home, or get along with other people    Depression Interventions/Treatment      There were no vitals filed for this visit.  Medications Reviewed Today     Reviewed by Merlynn Lyle CROME, LCSW (Social Worker) on 07/27/24 at 1207  Med List Status: <None>   Medication Order Taking? Sig Documenting Provider Last Dose Status Informant  aspirin EC 81 MG tablet [id number]  Take 81 mg by mouth daily. Swallow whole. [provider]  Active   benztropine  (COGENTIN ) 1 MG tablet [id number]  1 tablet at bedtime. [provider]  Active Self           Med Note LILIAN BRUNO JONELLE [place] Sep 10, 2016  2:40 PM) Received from: External Pharmacy  benztropine  (COGENTIN ) 1 MG tablet [id number]  Take 1 tablet (1 mg total) by mouth at bedtime.   Active   Cholecalciferol (VITAMIN D -3) 125 MCG (5000 UT) TABS [id number]  Take by mouth. [provider]  Active   citalopram  (CELEXA ) 40 MG tablet [id number]  Take 40 mg by mouth daily. [provider]  Active  citalopram  (CELEXA ) 40 MG tablet [id number]  Take 1 tablet (40 mg total) by mouth daily.   Active   citalopram  (CELEXA ) 40 MG tablet [id number]  Take 1 tablet (40 mg total) by mouth daily.   Active   levothyroxine  (SYNTHROID ) 75 MCG tablet [id number]  Take 1 tablet (75 mcg total) by mouth daily. [place] HERO, FNP  Active   risperiDONE  (RISPERDAL ) 2 MG tablet [id number]  Take 2 mg by  mouth at bedtime. [provider]  Active   risperiDONE  (RISPERDAL ) 2 MG tablet [id number]  Take 1 tablet (2 mg total) by mouth at bedtime.   Active   risperiDONE  (RISPERDAL ) 2 MG tablet [id number]  Take 1 tablet (2 mg total) by mouth at bedtime.   Active   risperiDONE  (RISPERDAL ) 2 MG tablet [id number]  Take 1 tablet (2 mg total) by mouth at bedtime.   Active             Recommendation:   PCP Follow-up Continue Current Plan of Care  Follow Up Plan:   Telephone follow-up in 1 month  Lyle Rung, BSW, MSW, LCSW Licensed Clinical Social Worker [employer]   [facility] [email or web address] Direct Dial: [phone]

## 2024-07-27 NOTE — Patient Instructions (Signed)
 Visit Information  Thank you for taking time to visit with me today. Please don't hesitate to contact me if I can be of assistance to you before our next scheduled appointment.  Please call the Suicide and Crisis Lifeline: 988 call the [place]  National Suicide Prevention Lifeline: (636) 230-3093 or TTY: (914) 006-2889 TTY (613)762-9221) to talk to a trained counselor call 1-800-273-TALK (toll free, 24 hour hotline) go to [facility] [address] [place] [phone]) call the [facility] Crisis Line: [phone] call 911 if you are experiencing a Mental Health or Behavioral Health Crisis or need someone to talk to.  Lyle Rung, BSW, MSW, LCSW Licensed Clinical Social Worker [employer]   [email or web address] Direct Dial: [phone]

## 2024-08-05 ENCOUNTER — Other Ambulatory Visit: Payer: Self-pay | Admitting: Licensed Clinical Social Worker

## 2024-08-05 NOTE — Patient Instructions (Signed)
 Visit Information  Thank you for taking time to visit with me today. Please don't hesitate to contact me if I can be of assistance to you before our next scheduled appointment.  Your next care management appointment is by telephone on 08/29/24 at 9 am  Please call the care guide team at [phone] if you need to cancel, schedule, or reschedule an appointment.   Please call the Suicide and Crisis Lifeline: 988 call the [place]  National Suicide Prevention Lifeline: 352-446-5509 or TTY: (678)849-1961 TTY 949-167-6628) to talk to a trained counselor call 1-800-273-TALK (toll free, 24 hour hotline) go to [facility] [address] [place] [phone]) call the [facility] Crisis Line: [phone] call 911 if you are experiencing a Mental Health or Behavioral Health Crisis or need someone to talk to.  Lyle Rung, BSW, MSW, LCSW Licensed Clinical Social Worker [employer]   [facility] [email or web address] Direct Dial: [phone]

## 2024-08-05 NOTE — Patient Outreach (Signed)
 Complex Care Management   Visit Note  08/05/2024  Name:  Amber Acevedo MRN: [id number] DOB: 05-May-1966  Situation: Referral received for Complex Care Management related to Mental/Behavioral Health diagnosis depression/anxiety. I obtained verbal consent from Patient.  Visit completed with Patient  on the phone  Background:   Past Medical History:  Diagnosis Date   Arthritis    Diabetes mellitus without complication (HCC)    Hypertension    Thyroid  disease     Assessment: Patient Reported Symptoms:  Cognitive Cognitive Status: Able to follow simple commands, Alert and oriented to person, place, and time Cognitive/Intellectual Conditions Management [RPT]: None reported or documented in medical history or problem list   Health Maintenance Behaviors: Annual physical exam Healing Pattern: Average Health Facilitated by: Rest, Stress management, Prayer/meditation  Neurological Neurological Review of Symptoms: No symptoms reported Neurological Management Strategies: Routine screening Neurological Self-Management Outcome: 4 (good)  HEENT HEENT Symptoms Reported: No symptoms reported HEENT Management Strategies: Routine screening HEENT Self-Management Outcome: 4 (good)    Cardiovascular Cardiovascular Symptoms Reported: No symptoms reported Does patient have uncontrolled Hypertension?: No Cardiovascular Management Strategies: Routine screening, Adequate rest Cardiovascular Self-Management Outcome: 4 (good)  Respiratory Respiratory Symptoms Reported: No symptoms reported    Endocrine Endocrine Symptoms Reported: No symptoms reported Is patient diabetic?: No Endocrine Self-Management Outcome: 4 (good)  Psychosocial Psychosocial Symptoms Reported: Sadness - if selected complete PHQ 2-9 Additional Psychological Details: Pt has upcoming therapy appt this friday with [facility] Behavioral Management Strategies: Medication therapy, Coping strategies, Adequate rest Behavioral Health  Self-Management Outcome: 4 (good) Major Change/Loss/Stressor/Fears (CP): Medical condition, self Techniques to Cope with Loss/Stress/Change: Medication, Counseling Quality of Family Relationships: helpful Do you feel physically threatened by others?: No    08/05/2024    PHQ2-9 Depression Screening   Little interest or pleasure in doing things Several days  Feeling down, depressed, or hopeless Several days  PHQ-2 - Total Score 2  Trouble falling or staying asleep, or sleeping too much More than half the days  Feeling tired or having little energy More than half the days  Poor appetite or overeating  Not at all  Feeling bad about yourself - or that you are a failure or have let yourself or your family down Several days  Trouble concentrating on things, such as reading the newspaper or watching television Not at all  Moving or speaking so slowly that other people could have noticed.  Or the opposite - being so fidgety or restless that you have been moving around a lot more than usual Not at all  Thoughts that you would be better off dead, or hurting yourself in some way Not at all  PHQ2-9 Total Score 7  If you checked off any problems, how difficult have these problems made it for you to do your work, take care of things at home, or get along with other people Somewhat difficult  Depression Interventions/Treatment Medication, Currently on Treatment    There were no vitals filed for this visit.  Medications Reviewed Today     Reviewed by Merlynn Lyle CROME, LCSW (Social Worker) on 08/05/24 at 1016  Med List Status: <None>   Medication Order Taking? Sig Documenting Provider Last Dose Status Informant  aspirin EC 81 MG tablet [id number]  Take 81 mg by mouth daily. Swallow whole. [provider]  Active   benztropine  (COGENTIN ) 1 MG tablet [id number]  1 tablet at bedtime. [provider]  Active Self  Med Note LILIAN BRUNO JONELLE [place] Sep 10, 2016  2:40 PM) Received  from: External Pharmacy  benztropine  (COGENTIN ) 1 MG tablet [id number]  Take 1 tablet (1 mg total) by mouth at bedtime.   Active   Cholecalciferol (VITAMIN D -3) 125 MCG (5000 UT) TABS [id number]  Take by mouth. [provider]  Active   citalopram  (CELEXA ) 40 MG tablet [id number]  Take 40 mg by mouth daily. [provider]  Active   citalopram  (CELEXA ) 40 MG tablet [id number]  Take 1 tablet (40 mg total) by mouth daily.   Active   citalopram  (CELEXA ) 40 MG tablet [id number]  Take 1 tablet (40 mg total) by mouth daily.   Active   levothyroxine  (SYNTHROID ) 75 MCG tablet [id number]  Take 1 tablet (75 mcg total) by mouth daily. [place] HERO, FNP  Active   risperiDONE  (RISPERDAL ) 2 MG tablet [id number]  Take 2 mg by mouth at bedtime. [provider]  Active   risperiDONE  (RISPERDAL ) 2 MG tablet [id number]  Take 1 tablet (2 mg total) by mouth at bedtime.   Active   risperiDONE  (RISPERDAL ) 2 MG tablet [id number]  Take 1 tablet (2 mg total) by mouth at bedtime.   Active   risperiDONE  (RISPERDAL ) 2 MG tablet [id number]  Take 1 tablet (2 mg total) by mouth at bedtime.   Active            SDOH Interventions    Flowsheet Row Patient Outreach from 08/05/2024 in [place] POPULATION HEALTH DEPARTMENT Patient Outreach Telephone from 06/26/2024 in [place] POPULATION HEALTH DEPARTMENT Patient Outreach Telephone from 06/09/2024 in [place] POPULATION HEALTH DEPARTMENT Patient Outreach Telephone from 05/19/2024 in [place] POPULATION HEALTH DEPARTMENT Patient Outreach Telephone from 05/07/2024 in [place] POPULATION HEALTH DEPARTMENT Patient Outreach Telephone from 04/29/2024 in [place] POPULATION HEALTH DEPARTMENT  SDOH Interventions        Food Insecurity Interventions Intervention Not Indicated -- -- -- Intervention Not Indicated Intervention Not Indicated  Housing Interventions Intervention Not Indicated -- -- -- Intervention Not Indicated Intervention Not Indicated   Transportation Interventions Other (Comment)  [Safe Ride] -- -- -- -- Other (Comment)  [Will use insurance Safe Ride coverage]  Utilities Interventions -- -- -- -- Intervention Not Indicated Intervention Not Indicated  Depression Interventions/Treatment  Medication, Currently on Treatment Medication, Currently on Treatment Medication, Counseling, Currently on Treatment Currently on Treatment, Medication, [employer] Provided Currently on Treatment, [employer] Provided --  Stress Interventions [employer] Provided, Education officer, environmental Provided, Provide Counseling -- [employer] Provided, Education officer, environmental Provided, Provide Counseling --      04/14/2024   10:22 AM 01/08/2024    2:29 PM  GAD 7 : Generalized Anxiety Score  Nervous, Anxious, on Edge 1 1  Control/stop worrying 1 1  Worry too much - different things 1 1  Trouble relaxing 1 1  Restless 1 1  Easily annoyed or irritable 0 0  Afraid - awful might happen 1 1  Total GAD 7 Score 6 6  Anxiety Difficulty Somewhat difficult Somewhat difficult    Recommendation:   PCP Follow-up Specialty provider follow-up [facility] Continue Current Plan of Care  Follow Up Plan:   Telephone follow-up in 1 month  Lyle Rung, BSW, MSW, LCSW Licensed Clinical Social Worker [employer]   [facility] [email or web address] Direct Dial: [phone]

## 2024-08-06 ENCOUNTER — Other Ambulatory Visit (HOSPITAL_COMMUNITY): Payer: Self-pay

## 2024-08-08 ENCOUNTER — Encounter (HOSPITAL_COMMUNITY): Payer: Self-pay

## 2024-08-08 ENCOUNTER — Ambulatory Visit (INDEPENDENT_AMBULATORY_CARE_PROVIDER_SITE_OTHER): Admitting: Clinical

## 2024-08-08 DIAGNOSIS — F331 Major depressive disorder, recurrent, moderate: Secondary | ICD-10-CM

## 2024-08-08 DIAGNOSIS — F419 Anxiety disorder, unspecified: Secondary | ICD-10-CM
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
## 2024-08-18 ENCOUNTER — Encounter: Payer: Self-pay | Admitting: Radiology

## 2024-08-28 ENCOUNTER — Encounter: Payer: Self-pay | Admitting: Family Medicine

## 2024-08-29 ENCOUNTER — Other Ambulatory Visit: Payer: Self-pay | Admitting: Licensed Clinical Social Worker

## 2024-08-29 NOTE — Patient Instructions (Signed)
 Visit Information  Thank you for taking time to visit with me today. Please don't hesitate to contact me if I can be of assistance to you before our next scheduled appointment.  Your next care management appointment is by telephone on 09/29/24 at 9:15 am  Telephone follow-up in 1 month  Please call the care guide team at [phone] if you need to cancel, schedule, or reschedule an appointment.   Please call the Suicide and Crisis Lifeline: 988 call the [place]  National Suicide Prevention Lifeline: 4458791145 or TTY: 902-523-0890 TTY 862-373-6094) to talk to a trained counselor call 1-800-273-TALK (toll free, 24 hour hotline) go to [facility] [address] [place] [phone]) call the [facility] Crisis Line: (779)824-3150 call 911 if you are experiencing a Mental Health or Behavioral Health Crisis or need someone to talk to.  Lyle Rung, BSW, MSW, LCSW Licensed Clinical Social Worker [employer]   [facility] [email or web address] Direct Dial: [phone]

## 2024-08-29 NOTE — Patient Outreach (Signed)
 Complex Care Management   Visit Note  08/29/2024  Name:  Amber Acevedo MRN: [id number] DOB: 07/25/1966  Situation: Referral received for Complex Care Management related to Mental/Behavioral Health diagnosis depression. I obtained verbal consent from Patient.  Visit completed with Patient  on the phone  Background:   Past Medical History:  Diagnosis Date   Arthritis    Diabetes mellitus without complication (HCC)    Hypertension    Thyroid  disease     Assessment: Patient Reported Symptoms:  Cognitive Cognitive Status: Able to follow simple commands, Alert and oriented to person, place, and time      Neurological Neurological Review of Symptoms: No symptoms reported Neurological Management Strategies: Routine screening Neurological Self-Management Outcome: 4 (good)  HEENT HEENT Symptoms Reported: No symptoms reported HEENT Management Strategies: Routine screening HEENT Self-Management Outcome: 4 (good)    Cardiovascular Cardiovascular Symptoms Reported: No symptoms reported Does patient have uncontrolled Hypertension?: No Cardiovascular Management Strategies: Routine screening Cardiovascular Self-Management Outcome: 4 (good)  Respiratory Respiratory Symptoms Reported: No symptoms reported Respiratory Management Strategies: Adequate rest, Coping strategies, Routine screening Respiratory Self-Management Outcome: 4 (good)  Endocrine Endocrine Symptoms Reported: No symptoms reported Is patient diabetic?: No Endocrine Self-Management Outcome: 4 (good)  Gastrointestinal Gastrointestinal Symptoms Reported: No symptoms reported      Genitourinary Genitourinary Symptoms Reported: Frequency Genitourinary Management Strategies: Coping strategies Genitourinary Self-Management Outcome: 3 (uncertain)  Integumentary Integumentary Symptoms Reported: No symptoms reported    Musculoskeletal Musculoskelatal Symptoms Reviewed: Back pain Musculoskeletal Management Strategies: Medical  device, Coping strategies, Medication therapy Musculoskeletal Self-Management Outcome: 3 (uncertain) Falls in the past year?: No Number of falls in past year: 1 or less Was there an injury with Fall?: No Fall Risk Category Calculator: 0 Patient Fall Risk Level: Low Fall Risk    Psychosocial Psychosocial Symptoms Reported: Sadness - if selected complete PHQ 2-9 Behavioral Management Strategies: Community resources, Coping strategies, Complementary therapy(ies), Counseling, Adequate rest, Medication therapy Behavioral Health Self-Management Outcome: 4 (good) Behavioral Health Comment: Therapy is helping me a lot Major Change/Loss/Stressor/Fears (CP): Medical condition, self Techniques to Cope with Loss/Stress/Change: Medication, Counseling Quality of Family Relationships: helpful Do you feel physically threatened by others?: No    08/29/2024    PHQ2-9 Depression Screening   Little interest or pleasure in doing things More than half the days  Feeling down, depressed, or hopeless More than half the days  PHQ-2 - Total Score 4  Trouble falling or staying asleep, or sleeping too much Nearly every day  Feeling tired or having little energy More than half the days  Poor appetite or overeating  Several days  Feeling bad about yourself - or that you are a failure or have let yourself or your family down More than half the days  Trouble concentrating on things, such as reading the newspaper or watching television More than half the days  Moving or speaking so slowly that other people could have noticed.  Or the opposite - being so fidgety or restless that you have been moving around a lot more than usual Not at all  Thoughts that you would be better off dead, or hurting yourself in some way Not at all  PHQ2-9 Total Score 14  If you checked off any problems, how difficult have these problems made it for you to do your work, take care of things at home, or get along with other people Somewhat  difficult  Depression Interventions/Treatment Medication, Counseling    There were no vitals filed for this visit.  Medications Reviewed Today     Reviewed by Merlynn Lyle CROME, LCSW (Social Worker) on 08/29/24 at 1345  Med List Status: <None>   Medication Order Taking? Sig Documenting Provider Last Dose Status Informant  aspirin EC 81 MG tablet [id number]  Take 81 mg by mouth daily. Swallow whole. [provider]  Active   benztropine  (COGENTIN ) 1 MG tablet [id number]  1 tablet at bedtime. [provider]  Active Self           Med Note LILIAN BRUNO JONELLE [place] Sep 10, 2016  2:40 PM) Received from: External Pharmacy  benztropine  (COGENTIN ) 1 MG tablet [id number]  Take 1 tablet (1 mg total) by mouth at bedtime.   Active   Cholecalciferol (VITAMIN D -3) 125 MCG (5000 UT) TABS [id number]  Take by mouth. [provider]  Active   citalopram  (CELEXA ) 40 MG tablet [id number]  Take 40 mg by mouth daily. [provider]  Active   citalopram  (CELEXA ) 40 MG tablet [id number]  Take 1 tablet (40 mg total) by mouth daily.   Active   citalopram  (CELEXA ) 40 MG tablet [id number]  Take 1 tablet (40 mg total) by mouth daily.   Active   levothyroxine  (SYNTHROID ) 75 MCG tablet [id number]  Take 1 tablet (75 mcg total) by mouth daily. Severa Rock HERO, FNP  Active   risperiDONE  (RISPERDAL ) 2 MG tablet [id number]  Take 2 mg by mouth at bedtime. [provider]  Active   risperiDONE  (RISPERDAL ) 2 MG tablet [id number]  Take 1 tablet (2 mg total) by mouth at bedtime.   Active   risperiDONE  (RISPERDAL ) 2 MG tablet [id number]  Take 1 tablet (2 mg total) by mouth at bedtime.   Active   risperiDONE  (RISPERDAL ) 2 MG tablet [id number]  Take 1 tablet (2 mg total) by mouth at bedtime.   Active             Recommendation:   PCP Follow-up Continue Current Plan of Care  Follow Up Plan:   Telephone follow-up in 1 month  Lyle Merlynn, BSW, MSW, LCSW Licensed Clinical Social  Worker [employer]   [email or web address] Direct Dial: [phone]

## 2024-09-01 ENCOUNTER — Other Ambulatory Visit (HOSPITAL_COMMUNITY): Payer: Self-pay

## 2024-09-04 ENCOUNTER — Ambulatory Visit (INDEPENDENT_AMBULATORY_CARE_PROVIDER_SITE_OTHER): Admitting: Clinical

## 2024-09-04 DIAGNOSIS — F419 Anxiety disorder, unspecified: Secondary | ICD-10-CM | POA: Diagnosis not present

## 2024-09-04 DIAGNOSIS — F331 Major depressive disorder, recurrent, moderate: Secondary | ICD-10-CM | POA: Diagnosis not present

## 2024-09-04 NOTE — Progress Notes (Signed)
 Virtual Visit via Video Note  I connected with Amber Acevedo on 09/04/24 at 11:00 AM EST by a video enabled telemedicine application and verified that I am speaking with the correct person using two identifiers.  Location: Patient: home Provider: office   I discussed the limitations of evaluation and management by telemedicine and the availability of in person appointments. The patient expressed understanding and agreed to proceed.  THERAPIST PROGRESS NOTE   Session Time: 11:00 AM-11:25 AM   Participation Level: Active   Behavioral Response: CasualAlertDepressed   Type of Therapy: Individual Therapy   Treatment Goals addressed: focused work on symptom management for the patients identfied MH diagnoses   Interventions: CBT, Motivational Interviewing, Solution Focused and Strength-based   Summary: Amber Acevedo is a 58 y.o. female who presents with Recurrent Moderate MDD with Anxiety . The OPT therapist worked with the patient for her ongoing OPT treatment. The OPT therapist utilized Motivational Interviewing to assist in creating therapeutic repore. The patient in the session was engaged and work in collaboration giving feedback about her triggers and symptoms over the past few weeks through mid November . The patient spoke about struggle with anxiety and automatic negative thoughts effecting her mood . The OPT therapist utilized Cognitive Behavioral Therapy through cognitive restructuring as well as worked with the patient on coping strategies to assist in management of mood and decrease Anxiety. The patient worked in session on practice of challenging automatic negative thoughts utilizing her real life recent examples of automatic negative thoughts as the template for in session practice work.The OPT therapist worked with the patient providing support and psycho-education. The Opt therapist worked with the patient on implementing positive thinking. The patient spoke about her willingness to  lean into coping skills and work on her in home independence.The OPT therapist promoted the patient to change gears and get out of her room more frequently to combat isolation even while acknowledging her mobility challenges (currently in wheelchair). The patient will be seeing her psychiatrist  at [facility] again soon and continuing work to dial in her med therapy to provide optimal response in management of her MH symptoms. The      Suicidal/Homicidal: Nowithout intent/plan   Therapist Response: The OPT therapist worked with the patient for the patients scheduled session. The patient was engaged in her session and gave feedback in relation to triggers, symptoms, and behavior responses over the past few weeks. The patient spoke about her difficulty in having more anxiety recently due to some negative not fact based thinking. The patient was responsiveThe patient worked in session on practice of challenging automatic negative thoughts utilizing her real life recent examples of automatic negative thoughts as the template for in session practice work. in the session and verbalized,  I had a morning where when I work up I had difficulty at first in opening my eyes and my thought was am I dying and then throughout the next day or 2 I keep worrying about dying. The OPT therapist also encouraged adjusting room so that its manageable and leaning into her coping skills while promoting ongoing independence in home. The OPT therapist placed emphasis on the patient continuing to challenge negative thinking and utilizing coping strategies including change in environment when possible even with her limited mobility when safe to do so. The patient will be seeing her psychiatrist  at [facility] again soon and continuing work to dial in her med therapy to provide optimal response in management of her MH symptoms.  Plan: Return again in 2/3 weeks.   Diagnosis:      Axis I: Recurrent MDD with Anxiety                            Axis II: No diagnosis   Collaboration of Care:  No additional collaboration   Patient/Guardian was advised Release of Information must be obtained prior to any record release in order to collaborate their care with an outside provider. Patient/Guardian was advised if they have not already done so to contact the registration department to sign all necessary forms in order for us  to release information regarding their care.    Consent: Patient/Guardian gives verbal consent for treatment and assignment of benefits for services provided during this visit. Patient/Guardian expressed understanding and agreed to proceed       I discussed the assessment and treatment plan with the patient. The patient was provided an opportunity to ask questions and all were answered. The patient agreed with the plan and demonstrated an understanding of the instructions.   The patient was advised to call back or seek an in-person evaluation if the symptoms worsen or if the condition fails to improve as anticipated.   I provided 25 minutes of non-face-to-face time during this encounter.   Jerel ONEIDA Pepper, LCSW   09/04/2024

## 2024-09-29 ENCOUNTER — Telehealth: Admitting: Licensed Clinical Social Worker

## 2024-09-30 ENCOUNTER — Other Ambulatory Visit (HOSPITAL_COMMUNITY): Payer: Self-pay

## 2024-09-30 ENCOUNTER — Ambulatory Visit (INDEPENDENT_AMBULATORY_CARE_PROVIDER_SITE_OTHER): Admitting: Clinical

## 2024-09-30 ENCOUNTER — Other Ambulatory Visit: Payer: Self-pay | Admitting: Licensed Clinical Social Worker

## 2024-09-30 DIAGNOSIS — F331 Major depressive disorder, recurrent, moderate: Secondary | ICD-10-CM

## 2024-09-30 DIAGNOSIS — F419 Anxiety disorder, unspecified: Secondary | ICD-10-CM | POA: Diagnosis not present

## 2024-09-30 NOTE — Patient Outreach (Signed)
 Complex Care Management   Visit Note  09/30/2024  Name:  Amber Acevedo MRN: [id number] DOB: 1965/12/24  Situation: Referral received for Complex Care Management related to Mental/Behavioral Health diagnosis depression. I obtained verbal consent from Patient.  Visit completed with Patient  on the phone  Background:   Past Medical History:  Diagnosis Date   Arthritis    Diabetes mellitus without complication (HCC)    Hypertension    Thyroid  disease     Assessment: Patient Reported Symptoms:  Cognitive Cognitive Status: Able to follow simple commands, Alert and oriented to person, place, and time Cognitive/Intellectual Conditions Management [RPT]: None reported or documented in medical history or problem list   Health Maintenance Behaviors: Annual physical exam Healing Pattern: Average Health Facilitated by: Rest, Stress management, Prayer/meditation  Neurological Neurological Review of Symptoms: No symptoms reported Neurological Management Strategies: Routine screening Neurological Self-Management Outcome: 4 (good)  HEENT HEENT Symptoms Reported: No symptoms reported HEENT Management Strategies: Routine screening HEENT Self-Management Outcome: 4 (good)    Cardiovascular Cardiovascular Symptoms Reported: No symptoms reported Does patient have uncontrolled Hypertension?: No Cardiovascular Management Strategies: Routine screening Cardiovascular Self-Management Outcome: 4 (good)  Respiratory Respiratory Symptoms Reported: No symptoms reported Respiratory Management Strategies: Routine screening, Coping strategies, Adequate rest Respiratory Self-Management Outcome: 4 (good)  Endocrine Endocrine Symptoms Reported: No symptoms reported Is patient diabetic?: No Endocrine Self-Management Outcome: 4 (good)  Gastrointestinal Gastrointestinal Symptoms Reported: No symptoms reported      Genitourinary Genitourinary Symptoms Reported: Frequency    Integumentary Integumentary  Symptoms Reported: No symptoms reported    Musculoskeletal Musculoskelatal Symptoms Reviewed: Back pain Musculoskeletal Management Strategies: Routine screening, Medical device, Medication therapy, Coping strategies, Adequate rest Musculoskeletal Self-Management Outcome: 3 (uncertain) Musculoskeletal Comment: Pt denies needing PCS on 09/30/24. Patient reports wanting to remain as active and independent as possible      Psychosocial Psychosocial Symptoms Reported: No symptoms reported Behavioral Management Strategies: Counseling, Coping strategies, Complementary therapy(ies), Adequate rest, Medication therapy Behavioral Health Self-Management Outcome: 4 (good) Major Change/Loss/Stressor/Fears (CP): Denies Techniques to Cope with Loss/Stress/Change: Not applicable Quality of Family Relationships: helpful, involved Do you feel physically threatened by others?: No    09/30/2024    PHQ2-9 Depression Screening   Little interest or pleasure in doing things Not at all  Feeling down, depressed, or hopeless Several days  PHQ-2 - Total Score 1  Trouble falling or staying asleep, or sleeping too much More than half the days  Feeling tired or having little energy More than half the days  Poor appetite or overeating  Not at all  Feeling bad about yourself - or that you are a failure or have let yourself or your family down Several days  Trouble concentrating on things, such as reading the newspaper or watching television More than half the days  Moving or speaking so slowly that other people could have noticed.  Or the opposite - being so fidgety or restless that you have been moving around a lot more than usual Not at all  Thoughts that you would be better off dead, or hurting yourself in some way Not at all  PHQ2-9 Total Score 8  If you checked off any problems, how difficult have these problems made it for you to do your work, take care of things at home, or get along with other people Somewhat  difficult  Depression Interventions/Treatment Currently on Treatment, [employer] Provided, Medication   SDOH Interventions    Flowsheet Row Patient Outreach Telephone from 09/30/2024 in [place]  POPULATION HEALTH DEPARTMENT Patient Outreach Telephone from 08/29/2024 in [place] POPULATION HEALTH DEPARTMENT Patient Outreach from 08/05/2024 in [place] POPULATION HEALTH DEPARTMENT Patient Outreach Telephone from 06/26/2024 in [place] POPULATION HEALTH DEPARTMENT Patient Outreach Telephone from 06/09/2024 in [place] POPULATION HEALTH DEPARTMENT Patient Outreach Telephone from 05/19/2024 in [place] POPULATION HEALTH DEPARTMENT  SDOH Interventions        Food Insecurity Interventions Intervention Not Indicated Intervention Not Indicated Intervention Not Indicated -- -- --  Housing Interventions -- -- Intervention Not Indicated -- -- --  Transportation Interventions Other (Comment), Patient Resources (Friends/Family)  [Safe Ride] Other (Comment)  [Safe Ride] Other (Comment)  [Safe Ride] -- -- --  Utilities Interventions Intervention Not Indicated -- -- -- -- --  Depression Interventions/Treatment  Currently on Treatment, Community Resources Provided, Medication Medication, Counseling Medication, Currently on Treatment Medication, Currently on Treatment Medication, Counseling, Currently on Treatment Currently on Treatment, Medication, Radiation Protection Practitioner Strain Interventions Intervention Not Indicated -- -- -- -- --  Stress Interventions Community Resources Provided, Education Officer, Environmental Provided [employer] Provided, Education Officer, Environmental Provided, Provide Counseling -- [employer] Provided, Provide Counseling   There were no vitals filed for this visit.    Medications Reviewed Today     Reviewed by Merlynn Lyle CROME, LCSW (Social Worker) on 09/30/24 at 1043  Med List Status: <None>   Medication Order  Taking? Sig Documenting Provider Last Dose Status Informant  aspirin EC 81 MG tablet [id number]  Take 81 mg by mouth daily. Swallow whole. [provider]  Active   benztropine  (COGENTIN ) 1 MG tablet [id number]  1 tablet at bedtime. [provider]  Active Self           Med Note LILIAN BRUNO JONELLE [place] Sep 10, 2016  2:40 PM) Received from: External Pharmacy  benztropine  (COGENTIN ) 1 MG tablet [id number]  Take 1 tablet (1 mg total) by mouth at bedtime.   Active   Cholecalciferol (VITAMIN D -3) 125 MCG (5000 UT) TABS [id number]  Take by mouth. [provider]  Active   citalopram  (CELEXA ) 40 MG tablet [id number]  Take 40 mg by mouth daily. [provider]  Active   citalopram  (CELEXA ) 40 MG tablet [id number]  Take 1 tablet (40 mg total) by mouth daily.   Active   citalopram  (CELEXA ) 40 MG tablet [id number]  Take 1 tablet (40 mg total) by mouth daily.   Active   levothyroxine  (SYNTHROID ) 75 MCG tablet [id number]  Take 1 tablet (75 mcg total) by mouth daily. Severa Rock HERO, FNP  Active   risperiDONE  (RISPERDAL ) 2 MG tablet [id number]  Take 2 mg by mouth at bedtime. [provider]  Active   risperiDONE  (RISPERDAL ) 2 MG tablet [id number]  Take 1 tablet (2 mg total) by mouth at bedtime.   Active   risperiDONE  (RISPERDAL ) 2 MG tablet [id number]  Take 1 tablet (2 mg total) by mouth at bedtime.   Active   risperiDONE  (RISPERDAL ) 2 MG tablet [id number]  Take 1 tablet (2 mg total) by mouth at bedtime.   Active             Recommendation:   PCP Follow-up Continue Current Plan of Care  Follow Up Plan:   Telephone follow-up in 1 month  Lyle Merlynn, BSW, MSW, LCSW Licensed Clinical Social Worker [employer]   [email or web address] Direct Dial: [phone]

## 2024-09-30 NOTE — Patient Instructions (Signed)
 Visit Information  Thank you for taking time to visit with me today. Please don't hesitate to contact me if I can be of assistance to you before our next scheduled appointment.  Our next appointment is by telephone on 10/28/23 at 330 pm Please call the care guide team at [phone] if you need to cancel or reschedule your appointment.   Following is a copy of your care plan:   Goals Addressed             This Visit's Progress    LCSW-VBCI Social Work Care Plan       Problems:   ADL IADL limitations, Inability to perform ADL's independently, Inability to perform IADL's independently, Mental Health Concerns , Social Isolation, and Transportation, Disease Management support and education needs related to Depression: depressed mood, feelings of worthlessness, guilt, and loss of energy/fatigue and Grief, and Social Connections  CSW Clinical Goal(s):   Over the next 90 days the Patient will attend all scheduled medical appointments as evidenced by patient report and care team review of appointment completion in electronic MEDICAL RECORD NUMBERby VBCI LCSW demonstrate a reduction in symptoms related to Anxiety with Excessive Worry, Grief  explore community resource options for unmet needs related to Depression   and Transportation work with Child Psychotherapist to address concerns related to increasing her support system.  Interventions:  Social Determinants of Health in Patient with Grief Stress at managing health conditions: SDOH assessments completed: Alcohol/Substance Use, Depression  , Financial Strain , Food Insecurity , Housing , Intimate Partner Violence, Physical Activity, Social Connections, Stress, Tobacco Use, and Transportation Evaluation of current treatment plan related to unmet needs Transportation resources: VBCI BSW appointment made to assist with specific wheelchair bound transportation barriers that patient is experiencing in her area Mental Health:  Evaluation of current  treatment plan related to Depression: anxiety and Grief Active listening / Reflection utilized Mining Engineer reviewed Financial Risk Analyst / information provided Depression screen reviewed Emotional Support Provided Mindfulness or Relaxation training provided Motivational Interviewing employed Participation in counseling encourage : patient wishes to take her time in considering this resource support PHQ2/PHQ9 completed Problem Solving /Task Center strategies reviewed Provided general psycho-education for mental health needs Quality of sleep assessed & Sleep Hygiene techniques promoted Reviewed mental health medications and discussed importance of compliance: [facility] manages her psychiatric medications but patient is open to transitioning her care to another practice if needed. She has a follow up this Friday and will make decision after this appointment regarding changing her [facility] providers or increasing her level of care. 05/19/24 update, patient successfully attended psychiatry follow up appointment and gained her psychiatric medications. She is agreeable to [facility] referral for therapy only next month. No urgent concerns reported. Crisis support resource education was physically wrote down by patient during outreach today. Patient has upcoming therapy appointment with Davene GLAD at [facility] for telephonic counseling on 09/04/24 and confirmed this appointment with VBCI LCSW today. She was encouraged to discuss psychiatry referral with therapist. Patient canceled her PCP appointment yesterday because her daughter was unable to get off work to provide transportation for her. Patient reports she is stable at this time. 09/30/24- Patient continues to be active with [facility]. She has not transferred psychiatry from [facility] to [facility] as of today. She reports that she prefers to address this after the new year. She denies any current crises. She reports that her daughter is coming to her  residence more often to check in on  her and to assist with chores.  Solution-Focued Strategies employed Suicidal Ideation/Homicidal Ideation assessed: No SI/HI  Patient Goals/Self-Care Activities:  Connect with provider for ongoing mental health treatment.   Increase coping skills, healthy habits, self-management skills, and stress reduction  Plan:   The care management team will reach out to the patient again over the next 30 days.        Please call the Suicide and Crisis Lifeline: 988 call the [place]  National Suicide Prevention Lifeline: 515-392-4625 or TTY: (629)712-9363 TTY 856-060-5326) to talk to a trained counselor call 1-800-273-TALK (toll free, 24 hour hotline) go to [facility] [address] [place] [phone]) call the [facility] Crisis Line: (717)765-4147 call 911 if you are experiencing a Mental Health or Behavioral Health Crisis or need someone to talk to.  Patient verbalized understanding of Care plan and visit instructions communicated this visit  Lyle Rung, BSW, MSW, LCSW Licensed Clinical Social Worker [employer]   [email or web address] Direct Dial: [phone]

## 2024-09-30 NOTE — Progress Notes (Signed)
 Virtual Visit via Video Note   I connected with Amber Acevedo on 09/30/24 at 9:00 AM EST by a video enabled telemedicine application and verified that I am speaking with the correct person using two identifiers.   Location: Patient: home Provider: office   I discussed the limitations of evaluation and management by telemedicine and the availability of in person appointments. The patient expressed understanding and agreed to proceed.   THERAPIST PROGRESS NOTE   Session Time: 9:00 AM-9:20 AM   Participation Level: Active   Behavioral Response: CasualAlertDepressed   Type of Therapy: Individual Therapy   Treatment Goals addressed: focused work on symptom management for the patients identfied MH diagnoses   Interventions: CBT, Motivational Interviewing, Solution Focused and Strength-based   Summary: Amber Acevedo is a 58 y.o. female who presents with Recurrent Moderate MDD with Anxiety . The OPT therapist worked with the patient for her ongoing OPT treatment. The OPT therapist utilized Motivational Interviewing to assist in creating therapeutic repore. The patient in the session was engaged and work in collaboration giving feedback about her triggers and symptoms over the past few weeks through mid December . The patient spoke about struggle with anxiety and automatic negative thoughts effecting her mood . The OPT therapist utilized Cognitive Behavioral Therapy through cognitive restructuring as well as worked with the patient on coping strategies to assist in management of mood and decrease Anxiety. The patient worked in session on practice of challenging automatic negative thoughts utilizing her real life recent examples of automatic negative thoughts as the template for in session practice work.The OPT therapist worked with the patient providing support and psycho-education. The Opt therapist worked with the patient on implementing positive thinking. The patient spoke about her willingness to  lean into coping skills including crocheting and reading her bible..The OPT therapist promoted the patient to change gears and get out of her room more frequently to combat isolation even while acknowledging her mobility challenges (currently in wheelchair). The patient spoke about her plans to get out of her house and get her daughter to take her out to do some Christmas shopping. The patient spoke about working on her Christmas plans, but looking forward to getting her granddaughter things for Christmas. The patient will be seeing her psychiatrist  at [facility] with next appointment Jan 2nd. The patient will be checking with the psychiatrist on getting a sleep aid as she has been sleeping only 2/3 hours at night. The OPT therapist is working with patient to regulate her sleep cycle. The patient will be seeing her PCP next in February for her physical health, but notes currently not having any flu or colds. .The patient is currently working with case worker/ social worker Lyle Rung.       Suicidal/Homicidal: Nowithout intent/plan   Therapist Response: The OPT therapist worked with the patient for the patients scheduled session. The patient was engaged in her session and gave feedback in relation to triggers, symptoms, and behavior responses over the past few weeks. The patient spoke about improvement with her anxiety recently due to practice of challenging automatic negative thoughts. The patient  verbalized,  I have been doing better with trying to think more positive and be active I have been crocheting and reading my bible. The OPT therapist also encouraged adjusting room so that its manageable and leaning into her coping skills while promoting ongoing independence in home. The patient is working with her case worker to get a in home health aid.The OPT therapist placed  emphasis on the patient continuing to challenge negative thinking and utilizing coping strategies including change in environment when  possible even with her limited mobility when safe to do so. The patient will be seeing her psychiatrist  at [facility] and well as her PCP in the beginning  of 2026.     Plan: Return again in 2/3 weeks.   Diagnosis:      Axis I: Recurrent MDD with Anxiety                           Axis II: No diagnosis   Collaboration of Care:  No additional collaboration   Patient/Guardian was advised Release of Information must be obtained prior to any record release in order to collaborate their care with an outside provider. Patient/Guardian was advised if they have not already done so to contact the registration department to sign all necessary forms in order for us  to release information regarding their care.    Consent: Patient/Guardian gives verbal consent for treatment and assignment of benefits for services provided during this visit. Patient/Guardian expressed understanding and agreed to proceed       I discussed the assessment and treatment plan with the patient. The patient was provided an opportunity to ask questions and all were answered. The patient agreed with the plan and demonstrated an understanding of the instructions.   The patient was advised to call back or seek an in-person evaluation if the symptoms worsen or if the condition fails to improve as anticipated.   I provided 20 minutes of non-face-to-face time during this encounter.   Jerel ONEIDA Pepper, LCSW   09/30/2024

## 2024-10-06 ENCOUNTER — Other Ambulatory Visit: Payer: Self-pay | Admitting: Family Medicine

## 2024-10-06 ENCOUNTER — Other Ambulatory Visit: Payer: Self-pay

## 2024-10-06 ENCOUNTER — Other Ambulatory Visit (HOSPITAL_COMMUNITY): Payer: Self-pay

## 2024-10-06 MED ORDER — LEVOTHYROXINE SODIUM 75 MCG PO TABS
75.0000 ug | ORAL_TABLET | Freq: Every day | ORAL | 0 refills | Status: AC
  Filled 2024-10-06: qty 90, 90d supply, fill #0

## 2024-10-06 NOTE — Telephone Encounter (Signed)
 Copied from CRM #[id number]. Topic: Clinical - Medication Refill >> Oct 06, 2024  8:18 AM Suzette B wrote: Medication: levothyroxine  (SYNTHROID ) 75 MCG tablet  Has the patient contacted their pharmacy? No: the pt stated the pharmacy called to notify her the refill was due This is the patient's preferred pharmacy:   [facility] [address] [place] [address] Phone: [phone] Fax: [phone]  Is this the correct pharmacy for this prescription? Yes If no, delete pharmacy and type the correct one.   Has the prescription been filled recently? Yes  Is the patient out of the medication? No 3 pills left   Has the patient been seen for an appointment in the last year OR does the patient have an upcoming appointment? Yes  Can we respond through MyChart? Yes  Agent: Please be advised that Rx refills may take up to 3 business days. We ask that you follow-up with your pharmacy.

## 2024-10-20 ENCOUNTER — Other Ambulatory Visit: Payer: Self-pay

## 2024-10-21 ENCOUNTER — Other Ambulatory Visit: Payer: Self-pay

## 2024-10-27 ENCOUNTER — Other Ambulatory Visit: Payer: Self-pay | Admitting: Licensed Clinical Social Worker

## 2024-10-27 NOTE — Patient Outreach (Signed)
 Complex Care Management   Visit Note  10/27/2024  Name:  Amber Acevedo MRN: [id number] DOB: [birth year]  Situation: Referral received for Complex Care Management related to Mental/Behavioral Health diagnosis depression and anxiety. I obtained verbal consent from Patient.  Visit completed with Patient  on the phone  Background:   Past Medical History:  Diagnosis Date   Arthritis    Diabetes mellitus without complication (HCC)    Hypertension    Thyroid  disease     Assessment: Patient Reported Symptoms:  Cognitive Cognitive Status: Able to follow simple commands, Alert and oriented to person, place, and time Cognitive/Intellectual Conditions Management [RPT]: None reported or documented in medical history or problem list   Health Maintenance Behaviors: Annual physical exam Healing Pattern: Average Health Facilitated by: Stress management, Rest, Prayer/meditation  Neurological Neurological Review of Symptoms: No symptoms reported Neurological Management Strategies: Routine screening Neurological Self-Management Outcome: 4 (good)  HEENT HEENT Symptoms Reported: No symptoms reported HEENT Management Strategies: Routine screening HEENT Self-Management Outcome: 4 (good)    Cardiovascular Cardiovascular Symptoms Reported: No symptoms reported Does patient have uncontrolled Hypertension?: No Cardiovascular Management Strategies: Routine screening Cardiovascular Self-Management Outcome: 4 (good)  Respiratory Respiratory Symptoms Reported: No symptoms reported Respiratory Management Strategies: Routine screening, Adequate rest, Coping strategies Respiratory Self-Management Outcome: 4 (good)  Endocrine Endocrine Symptoms Reported: No symptoms reported Is patient diabetic?: No Endocrine Self-Management Outcome: 4 (good)  Gastrointestinal Gastrointestinal Symptoms Reported: No symptoms reported      Genitourinary Genitourinary Symptoms Reported: Frequency    Integumentary  Integumentary Symptoms Reported: No symptoms reported    Musculoskeletal Musculoskelatal Symptoms Reviewed: Back pain, Limited mobility, Unsteady gait, Difficulty walking Musculoskeletal Management Strategies: Routine screening, Coping strategies, Medication therapy, Adequate rest Musculoskeletal Self-Management Outcome: 3 (uncertain)      Psychosocial Psychosocial Symptoms Reported: No symptoms reported Behavioral Management Strategies: Coping strategies, Counseling, Complementary therapy(ies), Adequate rest, Medication therapy Behavioral Health Self-Management Outcome: 4 (good) Major Change/Loss/Stressor/Fears (CP): Medical condition, self Techniques to Cope with Loss/Stress/Change: Not applicable Quality of Family Relationships: helpful, involved Do you feel physically threatened by others?: No    10/27/2024    PHQ2-9 Depression Screening   Little interest or pleasure in doing things Several days  Feeling down, depressed, or hopeless More than half the days  PHQ-2 - Total Score 3  Trouble falling or staying asleep, or sleeping too much More than half the days  Feeling tired or having little energy Nearly every day  Poor appetite or overeating  Not at all  Feeling bad about yourself - or that you are a failure or have let yourself or your family down Several days  Trouble concentrating on things, such as reading the newspaper or watching television More than half the days  Moving or speaking so slowly that other people could have noticed.  Or the opposite - being so fidgety or restless that you have been moving around a lot more than usual Not at all  Thoughts that you would be better off dead, or hurting yourself in some way Not at all  PHQ2-9 Total Score 11  If you checked off any problems, how difficult have these problems made it for you to do your work, take care of things at home, or get along with other people Somewhat difficult  Depression Interventions/Treatment Currently on  Treatment, Counseling, Medication    There were no vitals filed for this visit.    Medications Reviewed Today     Reviewed by Merlynn Lyle CROME, LCSW (Social Worker) on  10/27/24 at 1712  Med List Status: <None>   Medication Order Taking? Sig Documenting Provider Last Dose Status Informant  aspirin EC 81 MG tablet [id number]  Take 81 mg by mouth daily. Swallow whole. [provider]  Active   benztropine  (COGENTIN ) 1 MG tablet [id number]  1 tablet at bedtime. [provider]  Active Self           Med Note LILIAN BRUNO JONELLE [place] Sep 10, 2016  2:40 PM) Received from: External Pharmacy  benztropine  (COGENTIN ) 1 MG tablet [id number]  Take 1 tablet (1 mg total) by mouth at bedtime.   Active   Cholecalciferol (VITAMIN D -3) 125 MCG (5000 UT) TABS [id number]  Take by mouth. [provider]  Active   citalopram  (CELEXA ) 40 MG tablet [id number]  Take 40 mg by mouth daily. [provider]  Active   citalopram  (CELEXA ) 40 MG tablet [id number]  Take 1 tablet (40 mg total) by mouth daily.   Active   citalopram  (CELEXA ) 40 MG tablet [id number]  Take 1 tablet (40 mg total) by mouth daily.   Active   levothyroxine  (SYNTHROID ) 75 MCG tablet [id number]  Take 1 tablet (75 mcg total) by mouth daily. [place] HERO, FNP  Active   risperiDONE  (RISPERDAL ) 2 MG tablet [id number]  Take 2 mg by mouth at bedtime. [provider]  Active   risperiDONE  (RISPERDAL ) 2 MG tablet [id number]  Take 1 tablet (2 mg total) by mouth at bedtime.   Active   risperiDONE  (RISPERDAL ) 2 MG tablet [id number]  Take 1 tablet (2 mg total) by mouth at bedtime.   Active   risperiDONE  (RISPERDAL ) 2 MG tablet [id number]  Take 1 tablet (2 mg total) by mouth at bedtime.   Active             Recommendation:   PCP Follow-up Specialty provider follow-up [facility] Continue Current Plan of Care  Follow Up Plan:   Telephone follow-up in 1 month  Lyle Rung, BSW, MSW, LCSW Licensed Clinical Social  Worker [employer]   [facility] [email or web address] Direct Dial: [phone]

## 2024-10-27 NOTE — Patient Instructions (Signed)
 Visit Information  Thank you for taking time to visit with me today. Please don't hesitate to contact me if I can be of assistance to you before our next scheduled appointment.  Our next appointment is by telephone on 11/19/24 at 11 Please call the care guide team at [phone] if you need to cancel or reschedule your appointment.   Following is a copy of your care plan:   Goals Addressed             This Visit's Progress    LCSW-VBCI Social Work Care Plan   On track    Problems:   ADL IADL limitations, Inability to perform ADL's independently, Inability to perform IADL's independently, Mental Health Concerns , Social Isolation, and Transportation, Disease Management support and education needs related to Depression: depressed mood, feelings of worthlessness, guilt, and loss of energy/fatigue and Grief, and Social Connections  CSW Clinical Goal(s):   Over the next 90 days the Patient will attend all scheduled medical appointments as evidenced by patient report and care team review of appointment completion in electronic MEDICAL RECORD NUMBERby VBCI LCSW demonstrate a reduction in symptoms related to Anxiety with Excessive Worry, Grief  explore community resource options for unmet needs related to Depression   and Transportation work with Child Psychotherapist to address concerns related to increasing her support system.  Interventions:  Social Determinants of Health in Patient with Grief Stress at managing health conditions: SDOH assessments completed: Alcohol/Substance Use, Depression  , Financial Strain , Food Insecurity , Housing , Intimate Partner Violence, Physical Activity, Social Connections, Stress, Tobacco Use, and Transportation Evaluation of current treatment plan related to unmet needs Transportation resources: VBCI BSW appointment made to assist with specific wheelchair bound transportation barriers that patient is experiencing in her area Mental Health:  Evaluation of current  treatment plan related to Depression: anxiety and Grief Active listening / Reflection utilized Mining Engineer reviewed Financial Risk Analyst / information provided Depression screen reviewed Emotional Support Provided Mindfulness or Relaxation training provided Motivational Interviewing employed Participation in counseling encourage : patient wishes to take her time in considering this resource support PHQ2/PHQ9 completed Problem Solving /Task Center strategies reviewed Provided general psycho-education for mental health needs Quality of sleep assessed & Sleep Hygiene techniques promoted Reviewed mental health medications and discussed importance of compliance: [facility] manages her psychiatric medications but patient is open to transitioning her care to another practice if needed. She has a follow up this Friday and will make decision after this appointment regarding changing her [facility] providers or increasing her level of care. 05/19/24 update, patient successfully attended psychiatry follow up appointment and gained her psychiatric medications. She is agreeable to [facility] referral for therapy only next month. No urgent concerns reported. Crisis support resource education was physically wrote down by patient during outreach today. Patient has upcoming therapy appointment with Davene GLAD at [facility] for telephonic counseling on 09/04/24 and confirmed this appointment with VBCI LCSW today. She was encouraged to discuss psychiatry referral with therapist. Patient canceled her PCP appointment yesterday because her daughter was unable to get off work to provide transportation for her. Patient reports she is stable at this time. 09/30/24- Patient continues to be active with [facility]. She has not transferred psychiat[facility] from [facility] to [facility] as of today. She reports that she prefers to address this after the new year. She denies any current crises. She reports that her daughter is coming to her  residence more often to check in on  her and to assist with chores.  Solution-Focued Strategies employed Suicidal Ideation/Homicidal Ideation assessed: No SI/HI Telehealth talk therapy tomorrow on 10/28/24 confirmed with patient.  Patient reports recent decrease in mobility over the last 30 days. Patient continues utilize her wheelchair.   Patient Goals/Self-Care Activities:  Connect with provider for ongoing mental health treatment.   Increase coping skills, healthy habits, self-management skills, and stress reduction  Plan:   The care management team will reach out to the patient again over the next 30 days.        Please call the Suicide and Crisis Lifeline: 988 call the [place]  National Suicide Prevention Lifeline: 3328739924 or TTY: 321-021-2704 TTY 310 412 8893) to talk to a trained counselor call 1-800-273-TALK (toll free, 24 hour hotline) go to [facility] [address] [place] [phone]) call the [facility] Line: [phone] if you are experiencing a Mental Health or Behavioral Health Crisis or need someone to talk to.  Patient verbalized understanding of Care plan and visit instructions communicated this visit  Lyle Rung, BSW, MSW, LCSW Licensed Clinical Social Worker [employer]   [email or web address] Direct Dial: [phone]

## 2024-10-28 ENCOUNTER — Ambulatory Visit (HOSPITAL_COMMUNITY): Admitting: Clinical

## 2024-10-28 DIAGNOSIS — F419 Anxiety disorder, unspecified: Secondary | ICD-10-CM | POA: Diagnosis not present

## 2024-10-28 DIAGNOSIS — F331 Major depressive disorder, recurrent, moderate: Secondary | ICD-10-CM

## 2024-10-28 NOTE — Progress Notes (Signed)
 Virtual Visit via Video Note   I connected with Amber Acevedo on 10/28/24 at 10:00 AM EST by a video enabled telemedicine application and verified that I am speaking with the correct person using two identifiers.   Location: Patient: home Provider: office   I discussed the limitations of evaluation and management by telemedicine and the availability of in person appointments. The patient expressed understanding and agreed to proceed.   THERAPIST PROGRESS NOTE   Session Time: 10:00 AM-10:30 AM   Participation Level: Active   Behavioral Response: CasualAlertDepressed   Type of Therapy: Individual Therapy   Treatment Goals addressed: focused work on symptom management for the patients identfied MH diagnoses   Interventions: CBT, Motivational Interviewing, Solution Focused and Strength-based   Summary: Amber Acevedo is a 59 y.o. female who presents with Recurrent Moderate MDD with Anxiety . The OPT therapist worked with the patient for her ongoing OPT treatment. The OPT therapist utilized Motivational Interviewing to assist in creating therapeutic repore. The patient in the session was engaged and work in collaboration giving feedback about her triggers and symptoms over the past few weeks through mid December and over the holidays. The patient spoke about struggle with anxiety and automatic negative thoughts effecting her mood . The OPT therapist utilized Cognitive Behavioral Therapy through cognitive restructuring as well as worked with the patient on coping strategies to assist in management of mood and decrease Anxiety. The patient worked in session on practice of challenging automatic negative thoughts utilizing her real life recent examples of automatic negative thoughts as the template for in session practice work.The OPT therapist worked with the patient providing support and psycho-education. The Opt therapist worked with the patient on implementing positive thinking. The patient spoke  about her willingness to lean into coping skills including crocheting and reading her bible..The OPT therapist promoted the patient to change gears and get out of her room more frequently to combat isolation even while acknowledging her mobility challenges (currently in wheelchair). The patient spoke about her plans to get out of her house and get her daughter to take her out to do some Christmas shopping. The patient spoke about willingness to implement self check ins throughout the week and to practice thought checking.The patient will be seeing her PCP next in February for her physical health, but notes currently not having any flu or colds.The patient is currently working with case worker/ social worker Lyle Rung.       Suicidal/Homicidal: Nowithout intent/plan   Therapist Response: The OPT therapist worked with the patient for the patients scheduled session. The patient was engaged in her session and gave feedback in relation to triggers, symptoms, and behavior responses over the past few weeks. The patient spoke about the impacts of her automatic negative thoughts and worked in session utilizing the OPT therapists scenarios and the patients real life examples as part of the practice exercise in challenging negative thinking. The patient  verbalized,  I do create my own narrative a lot and I think this will help with the fact checking and positive thinking. The OPT therapist also encouraged adjusting room so that its manageable and leaning into her coping skills while promoting ongoing independence in home. The patient is working with her case worker to get a in home health aid.The OPT therapist placed emphasis on the patient continuing to challenge negative thinking and utilizing coping strategies including change in environment when possible even with her limited mobility when safe to do so. The patient will  continue her med therapy with psychiatrist at [facility].   Plan: Return again in 2/3 weeks.    Diagnosis:      Axis I: Recurrent MDD with Anxiety                           Axis II: No diagnosis   Collaboration of Care:  No additional collaboration   Patient/Guardian was advised Release of Information must be obtained prior to any record release in order to collaborate their care with an outside provider. Patient/Guardian was advised if they have not already done so to contact the registration department to sign all necessary forms in order for us  to release information regarding their care.    Consent: Patient/Guardian gives verbal consent for treatment and assignment of benefits for services provided during this visit. Patient/Guardian expressed understanding and agreed to proceed       I discussed the assessment and treatment plan with the patient. The patient was provided an opportunity to ask questions and all were answered. The patient agreed with the plan and demonstrated an understanding of the instructions.   The patient was advised to call back or seek an in-person evaluation if the symptoms worsen or if the condition fails to improve as anticipated.   I provided 30 minutes of non-face-to-face time during this encounter.   Jerel ONEIDA Pepper, LCSW   10/28/2024

## 2024-11-14 ENCOUNTER — Telehealth: Payer: Self-pay | Admitting: Family Medicine

## 2024-11-14 NOTE — Telephone Encounter (Signed)
 Please advise on this request, she will probably have to wait since you have never prescribed, correct?

## 2024-11-14 NOTE — Telephone Encounter (Signed)
 Copied from CRM [id number]. Topic: Clinical - Medication Refill >> Nov 14, 2024  4:17 PM Fredrica W wrote: Medication:  benztropine  (COGENTIN ) 1 MG tablet risperiDONE  (RISPERDAL ) 2 MG tablet citalopram  (CELEXA ) 40 MG tablet  Is supposed to see Dr at [facility] Tuesday but is still iced in may not be able to make it - they Dr is also out and is unable to prescribe without seeing patient. [facility] Advised she request bridge.   Has the patient contacted their pharmacy? No (Agent: If no, request that the patient contact the pharmacy for the refill. If patient does not wish to contact the pharmacy document the reason why and proceed with request.) (Agent: If yes, when and what did the pharmacy advise?)  This is the patient's preferred pharmacy:  [facility] [address] [place] [address] Phone: [phone] Fax: [phone]  Is this the correct pharmacy for this prescription? Yes If no, delete pharmacy and type the correct one.   Has the prescription been filled recently? No  Is the patient out of the medication? No - 5 days left  Has the patient been seen for an appointment in the last year OR does the patient have an upcoming appointment? Yes  Can we respond through MyChart? No  Agent: Please be advised that Rx refills may take up to 3 business days. We ask that you follow-up with your pharmacy.

## 2024-11-18 ENCOUNTER — Encounter: Payer: Self-pay | Admitting: Pharmacist

## 2024-11-18 ENCOUNTER — Other Ambulatory Visit (HOSPITAL_COMMUNITY): Payer: Self-pay

## 2024-11-18 ENCOUNTER — Other Ambulatory Visit: Payer: Self-pay

## 2024-11-18 MED ORDER — CITALOPRAM HYDROBROMIDE 40 MG PO TABS
40.0000 mg | ORAL_TABLET | Freq: Every day | ORAL | 0 refills | Status: AC
  Filled 2024-11-18: qty 30, 30d supply, fill #0

## 2024-11-18 MED ORDER — RISPERIDONE 2 MG PO TABS
2.0000 mg | ORAL_TABLET | Freq: Every day | ORAL | 0 refills | Status: AC
  Filled 2024-11-18: qty 30, 30d supply, fill #0

## 2024-11-18 MED ORDER — BENZTROPINE MESYLATE 1 MG PO TABS
1.0000 mg | ORAL_TABLET | Freq: Every day | ORAL | 0 refills | Status: AC
  Filled 2024-11-18: qty 30, 30d supply, fill #0

## 2024-11-19 ENCOUNTER — Other Ambulatory Visit: Payer: Self-pay | Admitting: Licensed Clinical Social Worker

## 2024-11-19 NOTE — Patient Outreach (Signed)
 Complex Care Management   Visit Note  11/19/2024  Name:  RAYONNA HELDMAN MRN: [id number] DOB: 12-02-1965  Situation: Referral received for Complex Care Management related to {Criteria:32550} I obtained verbal consent from {CHL AMB Patient/Caregiver:28184}.  Visit completed with {CHL AMB Patient/Caregiver:28184}  {VISIT LOCATION:32553}  Background:   Past Medical History:  Diagnosis Date   Arthritis    Diabetes mellitus without complication (HCC)    Hypertension    Thyroid  disease     Assessment: Patient Reported Symptoms:  Cognitive        Neurological      HEENT        Cardiovascular      Respiratory      Endocrine      Gastrointestinal        Genitourinary      Integumentary      Musculoskeletal          Psychosocial            11/19/2024    PHQ2-9 Depression Screening   Little interest or pleasure in doing things    Feeling down, depressed, or hopeless    PHQ-2 - Total Score    Trouble falling or staying asleep, or sleeping too much    Feeling tired or having little energy    Poor appetite or overeating     Feeling bad about yourself - or that you are a failure or have let yourself or your family down    Trouble concentrating on things, such as reading the newspaper or watching television    Moving or speaking so slowly that other people could have noticed.  Or the opposite - being so fidgety or restless that you have been moving around a lot more than usual    Thoughts that you would be better off dead, or hurting yourself in some way    PHQ2-9 Total Score    If you checked off any problems, how difficult have these problems made it for you to do your work, take care of things at home, or get along with other people    Depression Interventions/Treatment      There were no vitals filed for this visit.    Medications Reviewed Today   Medications were not reviewed in this encounter     Recommendation:   {RECOMMENDATONS:32554}  Follow Up  Plan:   {FOLLOWUP:32559}  SIG ***

## 2024-11-20 ENCOUNTER — Other Ambulatory Visit (HOSPITAL_COMMUNITY): Payer: Self-pay

## 2024-11-20 NOTE — Patient Instructions (Signed)
 Visit Information  Thank you for taking time to visit with me today. Please don't hesitate to contact me if I can be of assistance to you before our next scheduled appointment.  Your next care management appointment is by telephone on 12/15/24 at 1 pm  Telephone follow-up in 1 month  Please call the care guide team at [phone] if you need to cancel, schedule, or reschedule an appointment.   Please call the Suicide and Crisis Lifeline: 988 call the [place]  National Suicide Prevention Lifeline: 3328298892 or TTY: 952-734-6490 TTY (413) 220-8082) to talk to a trained counselor call 1-800-273-TALK (toll free, 24 hour hotline) go to [facility] [address] [place] [phone]) call the [facility] Crisis Line: 216-697-3197 call 911 if you are experiencing a Mental Health or Behavioral Health Crisis or need someone to talk to.  Lyle Rung, BSW, MSW, LCSW Licensed Clinical Social Worker [employer]   [email or web address] Direct Dial: [phone]

## 2024-11-21 ENCOUNTER — Other Ambulatory Visit: Payer: Self-pay

## 2024-12-02 ENCOUNTER — Ambulatory Visit (HOSPITAL_COMMUNITY): Admitting: Clinical

## 2024-12-04 ENCOUNTER — Encounter: Admitting: Family Medicine

## 2024-12-15 ENCOUNTER — Telehealth: Admitting: Licensed Clinical Social Worker

## 2025-04-06 ENCOUNTER — Ambulatory Visit

## 2025-04-07 ENCOUNTER — Ambulatory Visit: Payer: Self-pay
